# Patient Record
Sex: Female | Born: 1935 | Race: White | Hispanic: No | Smoking: Current every day smoker
Health system: Southern US, Community
[De-identification: ages and names within clinical notes are randomized; demographics above are authoritative.]

## PROBLEM LIST (undated history)

## (undated) DIAGNOSIS — E119 Type 2 diabetes mellitus without complications: Secondary | ICD-10-CM

## (undated) DIAGNOSIS — R42 Dizziness and giddiness: Secondary | ICD-10-CM

## (undated) DIAGNOSIS — I10 Essential (primary) hypertension: Secondary | ICD-10-CM

## (undated) DIAGNOSIS — G4733 Obstructive sleep apnea (adult) (pediatric): Secondary | ICD-10-CM

## (undated) DIAGNOSIS — I2510 Atherosclerotic heart disease of native coronary artery without angina pectoris: Secondary | ICD-10-CM

## (undated) DIAGNOSIS — E785 Hyperlipidemia, unspecified: Secondary | ICD-10-CM

## (undated) HISTORY — PX: OTHER SURGICAL HISTORY: SHX169

## (undated) HISTORY — PX: APPENDECTOMY: SHX54

## (undated) HISTORY — DX: Type 2 diabetes mellitus without complications: E11.9

## (undated) HISTORY — DX: Dizziness and giddiness: R42

## (undated) HISTORY — PX: CARDIAC CATHETERIZATION: SHX172

## (undated) HISTORY — DX: Essential (primary) hypertension: I10

## (undated) HISTORY — DX: Obstructive sleep apnea (adult) (pediatric): G47.33

## (undated) HISTORY — DX: Atherosclerotic heart disease of native coronary artery without angina pectoris: I25.10

## (undated) HISTORY — PX: FOOT SURGERY: SHX648

## (undated) HISTORY — PX: CATARACT EXTRACTION, BILATERAL: SHX1313

## (undated) HISTORY — PX: CHOLECYSTECTOMY: SHX55

## (undated) HISTORY — DX: Hyperlipidemia, unspecified: E78.5

## (undated) HISTORY — PX: TUBAL LIGATION: SHX77

---

## 1998-10-05 ENCOUNTER — Other Ambulatory Visit: Admission: RE | Admit: 1998-10-05 | Discharge: 1998-10-05 | Payer: Self-pay | Admitting: Obstetrics and Gynecology

## 1998-10-05 ENCOUNTER — Ambulatory Visit (HOSPITAL_COMMUNITY): Admission: RE | Admit: 1998-10-05 | Discharge: 1998-10-05 | Payer: Self-pay | Admitting: Obstetrics and Gynecology

## 2000-02-01 ENCOUNTER — Other Ambulatory Visit: Admission: RE | Admit: 2000-02-01 | Discharge: 2000-02-01 | Payer: Self-pay | Admitting: *Deleted

## 2000-02-01 ENCOUNTER — Ambulatory Visit (HOSPITAL_COMMUNITY): Admission: RE | Admit: 2000-02-01 | Discharge: 2000-02-01 | Payer: Self-pay | Admitting: Obstetrics and Gynecology

## 2000-02-01 ENCOUNTER — Encounter: Payer: Self-pay | Admitting: Obstetrics and Gynecology

## 2001-02-03 ENCOUNTER — Other Ambulatory Visit: Admission: RE | Admit: 2001-02-03 | Discharge: 2001-02-03 | Payer: Self-pay | Admitting: Obstetrics and Gynecology

## 2001-02-26 ENCOUNTER — Encounter: Payer: Self-pay | Admitting: Obstetrics and Gynecology

## 2001-02-26 ENCOUNTER — Ambulatory Visit (HOSPITAL_COMMUNITY): Admission: RE | Admit: 2001-02-26 | Discharge: 2001-02-26 | Payer: Self-pay | Admitting: Obstetrics and Gynecology

## 2002-04-02 ENCOUNTER — Other Ambulatory Visit: Admission: RE | Admit: 2002-04-02 | Discharge: 2002-04-02 | Payer: Self-pay | Admitting: Obstetrics and Gynecology

## 2002-04-07 ENCOUNTER — Encounter: Payer: Self-pay | Admitting: Obstetrics and Gynecology

## 2002-04-07 ENCOUNTER — Ambulatory Visit (HOSPITAL_COMMUNITY): Admission: RE | Admit: 2002-04-07 | Discharge: 2002-04-07 | Payer: Self-pay | Admitting: Obstetrics and Gynecology

## 2005-04-10 ENCOUNTER — Inpatient Hospital Stay (HOSPITAL_COMMUNITY): Admission: EM | Admit: 2005-04-10 | Discharge: 2005-04-11 | Payer: Self-pay | Admitting: Emergency Medicine

## 2006-07-19 ENCOUNTER — Emergency Department (HOSPITAL_COMMUNITY): Admission: EM | Admit: 2006-07-19 | Discharge: 2006-07-19 | Payer: Self-pay | Admitting: Emergency Medicine

## 2006-11-20 ENCOUNTER — Encounter: Admission: RE | Admit: 2006-11-20 | Discharge: 2006-11-20 | Payer: Self-pay | Admitting: Internal Medicine

## 2008-12-07 ENCOUNTER — Encounter: Admission: RE | Admit: 2008-12-07 | Discharge: 2008-12-07 | Payer: Self-pay | Admitting: Internal Medicine

## 2009-07-15 ENCOUNTER — Emergency Department (HOSPITAL_COMMUNITY): Admission: EM | Admit: 2009-07-15 | Discharge: 2009-07-15 | Payer: Self-pay | Admitting: Emergency Medicine

## 2010-04-05 ENCOUNTER — Ambulatory Visit (HOSPITAL_COMMUNITY): Admission: RE | Admit: 2010-04-05 | Discharge: 2010-04-05 | Payer: Self-pay | Admitting: Anesthesiology

## 2010-11-06 ENCOUNTER — Encounter: Admission: RE | Admit: 2010-11-06 | Discharge: 2010-11-06 | Payer: Self-pay | Admitting: Internal Medicine

## 2011-01-06 ENCOUNTER — Encounter: Payer: Self-pay | Admitting: Internal Medicine

## 2011-03-24 LAB — URINE MICROSCOPIC-ADD ON

## 2011-03-24 LAB — URINALYSIS, ROUTINE W REFLEX MICROSCOPIC
Bilirubin Urine: NEGATIVE
Glucose, UA: NEGATIVE mg/dL
Hgb urine dipstick: NEGATIVE
Ketones, ur: NEGATIVE mg/dL
Nitrite: NEGATIVE
Protein, ur: NEGATIVE mg/dL
Specific Gravity, Urine: 1.006 (ref 1.005–1.030)
Urobilinogen, UA: 0.2 mg/dL (ref 0.0–1.0)
pH: 6 (ref 5.0–8.0)

## 2011-03-24 LAB — POCT I-STAT, CHEM 8
BUN: 21 mg/dL (ref 6–23)
Calcium, Ion: 1.37 mmol/L — ABNORMAL HIGH (ref 1.12–1.32)
Chloride: 107 mEq/L (ref 96–112)
Creatinine, Ser: 1 mg/dL (ref 0.4–1.2)
Glucose, Bld: 102 mg/dL — ABNORMAL HIGH (ref 70–99)
HCT: 38 % (ref 36.0–46.0)
Hemoglobin: 12.9 g/dL (ref 12.0–15.0)
Potassium: 3.9 mEq/L (ref 3.5–5.1)
Sodium: 141 mEq/L (ref 135–145)
TCO2: 26 mmol/L (ref 0–100)

## 2011-03-24 LAB — CBC
HCT: 37.6 % (ref 36.0–46.0)
Hemoglobin: 12.9 g/dL (ref 12.0–15.0)
MCHC: 34.2 g/dL (ref 30.0–36.0)
MCV: 90.1 fL (ref 78.0–100.0)
Platelets: 239 10*3/uL (ref 150–400)
RBC: 4.18 MIL/uL (ref 3.87–5.11)
RDW: 15 % (ref 11.5–15.5)
WBC: 8.1 10*3/uL (ref 4.0–10.5)

## 2011-03-24 LAB — POCT CARDIAC MARKERS
CKMB, poc: 1.3 ng/mL (ref 1.0–8.0)
Myoglobin, poc: 137 ng/mL (ref 12–200)
Troponin i, poc: <0.05 ng/mL (ref 0.00–0.09)

## 2011-05-03 NOTE — Cardiovascular Report (Signed)
NAME:  Angela Craig, Angela Craig NO.:  [id number]   MEDICAL RECORD NO.:  [id number]          PATIENT TYPE:  INP   LOCATION:  3712                         FACILITY:  [facility]   PHYSICIAN:  Nanetta Batty, M.D.   DATE OF BIRTH:  November 07, 1933   DATE OF PROCEDURE:  DATE OF DISCHARGE:                              CARDIAC CATHETERIZATION   [name] is a 77 year old white female who I initially saw for  palpitations.  Other problems include hypertension and hyperlipidemia.  She  has a negative Cardiolite in the past and 2D echocardiogram.  She is  admitted on April 08, 2005 with chest pain.  She ruled out for myocardial  infarction.  She presents now for diagnostic coronary arteriography.   PROCEDURE DESCRIPTION:  The patient was brought to the second [facility] in the post absorptive state.  She is  premedicated with p.o. Valium.  She is allergic to Novocain and thus  lidocaine was used per protocol for local anesthesia.  A 6 French sheath was  inserted into the right femoral artery using standard Seldinger technique.  The 6 French right Judkins' diagnostic catheter as well as a 6 French  pigtail catheter were used for selective coronary angiography, and left  ventriculography, respectively.  Visipaque dye was used for the entirety of  the case.  Retrograde aorta, left ventricular __________ pressures were  recorded.   HEMODYNAMICS:  1.  Aortic systolic pressure 138, diastolic pressure 56.  2.  Left ventricular systolic pressure 142, end diastolic pressure 6.  There      is no pull back gradient noted.   SELECTIVE CORONARY ANGIOGRAPHY:  1.  Left main normal.  2.  Left anterior descending:  The LAD had a 40 to 50% segmental lesion at      the junction of the proximal and mid third.  Otherwise, it was free of      significant disease.  3.  Left circumflex:  The left circumflex had a 40 to 50% segmental proximal      stenosis at its take off.  4.  Right coronary artery:  This is a dominant vessel that had 60% tubular      stenosis at the first bend and 40-50% segmental stenosis in the mid      portion.  5.  Left ventriculography:  RAO left ventriculogram was performed using 25      cc of Visipaque dye 12 cc per second.  The overall left ventricular      ejection fraction was estimated greater than 60% without focal wall      motion abnormalities.   IMPRESSION:  [name] has moderate to critical coronary artery disease with  normal left ventricular function.  I believe her chest pain is noncardiac.  An ACT is measured and the sheaths were removed.  Pressure was held in the  groin to achieve hemostasis.  The patient left the laboratory in stable  condition.  She will be discharged and later seen as an outpatient and will  have an exercise Cardiolite stress test performed to assess the physiologic  significance of the proximal right coronary artery lesion after which time,  she will be seen back in followup.      JB/MEDQ  D:  04/11/2005  T:  04/11/2005  Job:  [id number]   cc:   [facility]   [facility]  [address], [place],  [place]  [address]   Lacretia Leigh. Quintella Reichert, M.D.

## 2011-05-03 NOTE — Discharge Summary (Signed)
[name], Angela Craig NO.:  [id number]   MEDICAL RECORD NO.:  [id number]          PATIENT TYPE:  INP   LOCATION:  3712                         FACILITY:  [facility]   PHYSICIAN:  Abelino Derrick, P.A.   DATE OF BIRTH:  [birth year]   DATE OF ADMISSION:  04/10/2005  DATE OF DISCHARGE:  04/11/2005                                 DISCHARGE SUMMARY   DISCHARGE DIAGNOSES:  1.  Chest pain, noncritical coronary disease at catheterization this      admission.  2.  Dyslipidemia.  3.  Treated hypertension.  4.  Non-insulin-dependent diabetes mellitus.   HOSPITAL COURSE:  The patient is a 77 year old female from [place], who  presented with three days of increasing palpitations and chest pressure.  Symptoms have been worse over the last 72 hours.  Please see Admission  History and Physical for complete details.  She has had a negative  Cardiolite in 2003 and an echocardiogram showing an EF of 60% in the past.  She was admitted by Dr. Clarene Duke.  Her primary doctor is Dr. __________in  [place].  EKG showed sinus rhythm without acute changes, her troponins were  negative x2.  She does have a history of NOVOCAIN ALLERGY.  She had frequent  PVCs on admission.  She underwent diagnostic catheterization, April 11, 2005, by Dr. Allyson Sabal, which revealed noncritical coronary disease with a 40 to  50% proximal circumflex, 40 to 50% mid RCA, and 40 to 50% mid LAD.  She was  sent home later that same day.   DISCHARGE MEDICATIONS:  1.  Lipitor 20 mg once a day.  2.  Nexium 40 mg a day.  3.  Avandia 4 mg twice a day.  4.  Benazepril/hydrochlorothiazide 10/12.5 once a day.  5.  Glucotrol-XL 10 mg a day.  6.  Toprol-XL 50 mg a day.  7.  Evista 60 mg a day.  8.  Aspirin daily.   LABORATORY DATA:  Chest x-ray showed mild cardiomegaly and mild to moderate  COPD.  White count 7.4, hemoglobin 12.5, hematocrit 36.5, platelets 236, INR  is 0.9.  Sodium 140, potassium 3.9, BUN 20, creatinine 0.9.   Liver functions  are normal.  CK-MB and troponins are negative x3.  Cholesterol is 197 with  an HDL of 37 and an LDL 123, TSH 0.98.  EKG, according to Dr. Fredirick Maudlin note,  showed sinus rhythm without acute changes and PVCs.   DISPOSITION:  The patient is discharged in stable condition and will follow  up with Dr. Allyson Sabal in a couple of weeks in the office.      LKK/MEDQ  D:  05/30/2005  T:  05/30/2005  Job:  [id number]

## 2011-07-08 ENCOUNTER — Other Ambulatory Visit (HOSPITAL_COMMUNITY): Payer: Self-pay | Admitting: Internal Medicine

## 2011-07-08 DIAGNOSIS — Z1231 Encounter for screening mammogram for malignant neoplasm of breast: Secondary | ICD-10-CM

## 2011-07-17 ENCOUNTER — Ambulatory Visit (HOSPITAL_COMMUNITY)
Admission: RE | Admit: 2011-07-17 | Discharge: 2011-07-17 | Disposition: A | Discharge status: Home / Self Care | Payer: Medicare Other | Source: Ambulatory Visit | Attending: Internal Medicine | Admitting: Internal Medicine

## 2011-07-17 DIAGNOSIS — Z1231 Encounter for screening mammogram for malignant neoplasm of breast: Secondary | ICD-10-CM | POA: Insufficient documentation

## 2012-08-24 ENCOUNTER — Other Ambulatory Visit (HOSPITAL_COMMUNITY): Payer: Self-pay | Admitting: Internal Medicine

## 2012-08-24 DIAGNOSIS — Z1231 Encounter for screening mammogram for malignant neoplasm of breast: Secondary | ICD-10-CM

## 2012-09-03 ENCOUNTER — Ambulatory Visit (HOSPITAL_COMMUNITY)
Admission: RE | Admit: 2012-09-03 | Discharge: 2012-09-03 | Disposition: A | Discharge status: Home / Self Care | Payer: Medicare Other | Source: Ambulatory Visit | Attending: Internal Medicine | Admitting: Internal Medicine

## 2012-09-03 DIAGNOSIS — Z1231 Encounter for screening mammogram for malignant neoplasm of breast: Secondary | ICD-10-CM

## 2012-09-16 ENCOUNTER — Ambulatory Visit: Payer: Medicare Other | Attending: Internal Medicine | Admitting: *Deleted

## 2012-09-16 DIAGNOSIS — IMO0001 Reserved for inherently not codable concepts without codable children: Secondary | ICD-10-CM | POA: Insufficient documentation

## 2012-09-16 DIAGNOSIS — Z9181 History of falling: Secondary | ICD-10-CM | POA: Insufficient documentation

## 2012-09-16 DIAGNOSIS — R269 Unspecified abnormalities of gait and mobility: Secondary | ICD-10-CM | POA: Insufficient documentation

## 2012-09-22 ENCOUNTER — Encounter: Payer: Medicare Other | Admitting: Physical Therapy

## 2012-09-29 ENCOUNTER — Encounter: Payer: Medicare Other | Admitting: *Deleted

## 2012-10-02 ENCOUNTER — Encounter: Payer: Medicare Other | Admitting: *Deleted

## 2012-10-06 ENCOUNTER — Encounter: Payer: Medicare Other | Admitting: *Deleted

## 2012-10-09 ENCOUNTER — Encounter: Payer: Medicare Other | Admitting: *Deleted

## 2012-10-13 ENCOUNTER — Encounter: Payer: Medicare Other | Admitting: *Deleted

## 2012-10-15 ENCOUNTER — Encounter: Payer: Medicare Other | Admitting: *Deleted

## 2012-10-19 ENCOUNTER — Encounter: Payer: Medicare Other | Admitting: *Deleted

## 2012-10-21 ENCOUNTER — Encounter: Payer: Medicare Other | Admitting: *Deleted

## 2014-03-25 ENCOUNTER — Other Ambulatory Visit (HOSPITAL_COMMUNITY): Payer: Self-pay | Admitting: Internal Medicine

## 2014-03-25 DIAGNOSIS — Z1231 Encounter for screening mammogram for malignant neoplasm of breast: Secondary | ICD-10-CM

## 2014-04-01 ENCOUNTER — Ambulatory Visit (HOSPITAL_COMMUNITY)
Admission: RE | Admit: 2014-04-01 | Discharge: 2014-04-01 | Disposition: A | Discharge status: Home / Self Care | Payer: Medicare Other | Source: Ambulatory Visit | Attending: Internal Medicine | Admitting: Internal Medicine

## 2014-04-01 DIAGNOSIS — Z1231 Encounter for screening mammogram for malignant neoplasm of breast: Secondary | ICD-10-CM

## 2014-12-27 DIAGNOSIS — I10 Essential (primary) hypertension: Secondary | ICD-10-CM | POA: Diagnosis not present

## 2014-12-27 DIAGNOSIS — E1129 Type 2 diabetes mellitus with other diabetic kidney complication: Secondary | ICD-10-CM | POA: Diagnosis not present

## 2015-01-03 DIAGNOSIS — I10 Essential (primary) hypertension: Secondary | ICD-10-CM | POA: Diagnosis not present

## 2015-01-03 DIAGNOSIS — E1121 Type 2 diabetes mellitus with diabetic nephropathy: Secondary | ICD-10-CM | POA: Diagnosis not present

## 2015-01-03 DIAGNOSIS — E785 Hyperlipidemia, unspecified: Secondary | ICD-10-CM | POA: Diagnosis not present

## 2015-01-03 DIAGNOSIS — E1165 Type 2 diabetes mellitus with hyperglycemia: Secondary | ICD-10-CM | POA: Diagnosis not present

## 2015-01-18 DIAGNOSIS — H3531 Nonexudative age-related macular degeneration: Secondary | ICD-10-CM | POA: Diagnosis not present

## 2015-01-18 DIAGNOSIS — Z961 Presence of intraocular lens: Secondary | ICD-10-CM | POA: Diagnosis not present

## 2015-01-18 DIAGNOSIS — H40033 Anatomical narrow angle, bilateral: Secondary | ICD-10-CM | POA: Diagnosis not present

## 2015-01-18 DIAGNOSIS — E119 Type 2 diabetes mellitus without complications: Secondary | ICD-10-CM | POA: Diagnosis not present

## 2015-01-25 DIAGNOSIS — R42 Dizziness and giddiness: Secondary | ICD-10-CM | POA: Diagnosis not present

## 2015-02-06 DIAGNOSIS — M9903 Segmental and somatic dysfunction of lumbar region: Secondary | ICD-10-CM | POA: Diagnosis not present

## 2015-02-06 DIAGNOSIS — M9904 Segmental and somatic dysfunction of sacral region: Secondary | ICD-10-CM | POA: Diagnosis not present

## 2015-02-06 DIAGNOSIS — M9902 Segmental and somatic dysfunction of thoracic region: Secondary | ICD-10-CM | POA: Diagnosis not present

## 2015-02-06 DIAGNOSIS — M9905 Segmental and somatic dysfunction of pelvic region: Secondary | ICD-10-CM | POA: Diagnosis not present

## 2015-02-14 DIAGNOSIS — H903 Sensorineural hearing loss, bilateral: Secondary | ICD-10-CM | POA: Diagnosis not present

## 2015-02-14 DIAGNOSIS — R42 Dizziness and giddiness: Secondary | ICD-10-CM | POA: Diagnosis not present

## 2015-03-09 ENCOUNTER — Other Ambulatory Visit (HOSPITAL_COMMUNITY): Payer: Self-pay | Admitting: Internal Medicine

## 2015-03-09 DIAGNOSIS — Z1231 Encounter for screening mammogram for malignant neoplasm of breast: Secondary | ICD-10-CM

## 2015-04-04 DIAGNOSIS — Z0000 Encounter for general adult medical examination without abnormal findings: Secondary | ICD-10-CM | POA: Diagnosis not present

## 2015-04-04 DIAGNOSIS — I10 Essential (primary) hypertension: Secondary | ICD-10-CM | POA: Diagnosis not present

## 2015-04-04 DIAGNOSIS — Z23 Encounter for immunization: Secondary | ICD-10-CM | POA: Diagnosis not present

## 2015-04-04 DIAGNOSIS — N390 Urinary tract infection, site not specified: Secondary | ICD-10-CM | POA: Diagnosis not present

## 2015-04-04 DIAGNOSIS — M810 Age-related osteoporosis without current pathological fracture: Secondary | ICD-10-CM | POA: Diagnosis not present

## 2015-04-04 DIAGNOSIS — E1121 Type 2 diabetes mellitus with diabetic nephropathy: Secondary | ICD-10-CM | POA: Diagnosis not present

## 2015-04-04 DIAGNOSIS — Z1389 Encounter for screening for other disorder: Secondary | ICD-10-CM | POA: Diagnosis not present

## 2015-04-11 DIAGNOSIS — E785 Hyperlipidemia, unspecified: Secondary | ICD-10-CM | POA: Diagnosis not present

## 2015-04-11 DIAGNOSIS — E1122 Type 2 diabetes mellitus with diabetic chronic kidney disease: Secondary | ICD-10-CM | POA: Diagnosis not present

## 2015-04-11 DIAGNOSIS — I10 Essential (primary) hypertension: Secondary | ICD-10-CM | POA: Diagnosis not present

## 2015-04-11 DIAGNOSIS — E1165 Type 2 diabetes mellitus with hyperglycemia: Secondary | ICD-10-CM | POA: Diagnosis not present

## 2015-04-17 ENCOUNTER — Ambulatory Visit (HOSPITAL_COMMUNITY)
Admission: RE | Admit: 2015-04-17 | Discharge: 2015-04-17 | Disposition: A | Discharge status: Home / Self Care | Payer: Medicare Other | Source: Ambulatory Visit | Attending: Internal Medicine | Admitting: Internal Medicine

## 2015-04-17 DIAGNOSIS — Z1231 Encounter for screening mammogram for malignant neoplasm of breast: Secondary | ICD-10-CM | POA: Diagnosis not present

## 2015-06-27 DIAGNOSIS — E1165 Type 2 diabetes mellitus with hyperglycemia: Secondary | ICD-10-CM | POA: Diagnosis not present

## 2015-06-27 DIAGNOSIS — I10 Essential (primary) hypertension: Secondary | ICD-10-CM | POA: Diagnosis not present

## 2015-06-27 DIAGNOSIS — M810 Age-related osteoporosis without current pathological fracture: Secondary | ICD-10-CM | POA: Diagnosis not present

## 2015-08-01 ENCOUNTER — Encounter: Payer: Self-pay | Admitting: Cardiovascular Disease

## 2015-08-07 DIAGNOSIS — F17211 Nicotine dependence, cigarettes, in remission: Secondary | ICD-10-CM | POA: Diagnosis not present

## 2015-08-07 DIAGNOSIS — N182 Chronic kidney disease, stage 2 (mild): Secondary | ICD-10-CM | POA: Diagnosis not present

## 2015-08-07 DIAGNOSIS — I130 Hypertensive heart and chronic kidney disease with heart failure and stage 1 through stage 4 chronic kidney disease, or unspecified chronic kidney disease: Secondary | ICD-10-CM | POA: Diagnosis not present

## 2015-08-07 DIAGNOSIS — E785 Hyperlipidemia, unspecified: Secondary | ICD-10-CM | POA: Diagnosis not present

## 2015-08-07 DIAGNOSIS — Z23 Encounter for immunization: Secondary | ICD-10-CM | POA: Diagnosis not present

## 2015-08-07 DIAGNOSIS — E1140 Type 2 diabetes mellitus with diabetic neuropathy, unspecified: Secondary | ICD-10-CM | POA: Diagnosis not present

## 2015-08-17 ENCOUNTER — Encounter: Payer: Self-pay | Admitting: Cardiovascular Disease

## 2015-08-29 DIAGNOSIS — M5032 Other cervical disc degeneration, mid-cervical region: Secondary | ICD-10-CM | POA: Diagnosis not present

## 2015-08-29 DIAGNOSIS — M9901 Segmental and somatic dysfunction of cervical region: Secondary | ICD-10-CM | POA: Diagnosis not present

## 2015-08-29 DIAGNOSIS — M9903 Segmental and somatic dysfunction of lumbar region: Secondary | ICD-10-CM | POA: Diagnosis not present

## 2015-08-29 DIAGNOSIS — M5432 Sciatica, left side: Secondary | ICD-10-CM | POA: Diagnosis not present

## 2015-08-29 DIAGNOSIS — M5126 Other intervertebral disc displacement, lumbar region: Secondary | ICD-10-CM | POA: Diagnosis not present

## 2015-09-01 DIAGNOSIS — R296 Repeated falls: Secondary | ICD-10-CM | POA: Diagnosis not present

## 2015-09-01 DIAGNOSIS — Z043 Encounter for examination and observation following other accident: Secondary | ICD-10-CM | POA: Diagnosis not present

## 2015-09-01 DIAGNOSIS — R269 Unspecified abnormalities of gait and mobility: Secondary | ICD-10-CM | POA: Diagnosis not present

## 2015-09-01 DIAGNOSIS — M549 Dorsalgia, unspecified: Secondary | ICD-10-CM | POA: Diagnosis not present

## 2015-09-01 DIAGNOSIS — M25559 Pain in unspecified hip: Secondary | ICD-10-CM | POA: Diagnosis not present

## 2015-09-01 DIAGNOSIS — M542 Cervicalgia: Secondary | ICD-10-CM | POA: Diagnosis not present

## 2015-09-01 DIAGNOSIS — S32030A Wedge compression fracture of third lumbar vertebra, initial encounter for closed fracture: Secondary | ICD-10-CM | POA: Diagnosis not present

## 2015-09-01 DIAGNOSIS — R2689 Other abnormalities of gait and mobility: Secondary | ICD-10-CM | POA: Diagnosis not present

## 2015-09-01 DIAGNOSIS — E119 Type 2 diabetes mellitus without complications: Secondary | ICD-10-CM | POA: Diagnosis not present

## 2015-09-14 DIAGNOSIS — R2689 Other abnormalities of gait and mobility: Secondary | ICD-10-CM | POA: Diagnosis not present

## 2015-09-14 DIAGNOSIS — R296 Repeated falls: Secondary | ICD-10-CM | POA: Diagnosis not present

## 2015-09-14 DIAGNOSIS — R269 Unspecified abnormalities of gait and mobility: Secondary | ICD-10-CM | POA: Diagnosis not present

## 2015-09-14 DIAGNOSIS — M549 Dorsalgia, unspecified: Secondary | ICD-10-CM | POA: Diagnosis not present

## 2015-10-04 ENCOUNTER — Encounter: Payer: Self-pay | Admitting: Neurology

## 2015-10-04 ENCOUNTER — Ambulatory Visit (INDEPENDENT_AMBULATORY_CARE_PROVIDER_SITE_OTHER): Payer: Medicare Other | Admitting: Neurology

## 2015-10-04 VITALS — BP 140/64 | HR 66 | Resp 16 | Ht 64.0 in | Wt 186.0 lb

## 2015-10-04 DIAGNOSIS — M47816 Spondylosis without myelopathy or radiculopathy, lumbar region: Secondary | ICD-10-CM

## 2015-10-04 DIAGNOSIS — R296 Repeated falls: Secondary | ICD-10-CM | POA: Diagnosis not present

## 2015-10-04 DIAGNOSIS — R29818 Other symptoms and signs involving the nervous system: Secondary | ICD-10-CM | POA: Diagnosis not present

## 2015-10-04 DIAGNOSIS — E1140 Type 2 diabetes mellitus with diabetic neuropathy, unspecified: Secondary | ICD-10-CM

## 2015-10-04 DIAGNOSIS — Z9181 History of falling: Secondary | ICD-10-CM | POA: Diagnosis not present

## 2015-10-04 DIAGNOSIS — G4733 Obstructive sleep apnea (adult) (pediatric): Secondary | ICD-10-CM

## 2015-10-04 DIAGNOSIS — R2689 Other abnormalities of gait and mobility: Secondary | ICD-10-CM

## 2015-10-04 NOTE — Progress Notes (Signed)
Subjective:    Patient ID: Angela Craig is a 81 y.o. female.  HPI     Huston Foley, MD, PhD [facility] [address] [address] [place], [place] [address]  Dear Dr. Thea Silversmith,   I saw your patient, Angela Craig, upon your kind request in my neurologic clinic today for initial consultation of her balance problems and recurrent falls. The patient is accompanied by her daughter, Angela Craig, today. As you know, Ms. Liss is a very pleasant 81 year old right-handed woman with an underlying medical history of type 2 diabetes, chronic renal failure, hypertension, hyperlipidemia, and low back pain, who reports recurrent falls. This has been going on for the past few years. Symptoms started gradually 2 or 3 years ago. She used to fall very infrequently, then with time she started falling about once a month, then once a week, and more recently in the past several weeks she has fallen almost twice a week. In the past she used to live by herself. Some 2 months ago she moved in with her daughter, Angela Craig, her daughters 37 year old son, and patient's son, Angela Craig. She has 5 children. She has a call alert button. She tried physical therapy about a year ago as an outpatient but it became too costly for her. She reports a tendency to stagger. She feels at times lightheaded. She has had vertigo before. Of note, she was on diabetes medication but as she improved her diet and lost weight, her numbers improved. She could not afford the special diet any longer and gained weight and her blood sugar values deteriorated as well. In addition, she has a prior diagnosis of obstructive sleep apnea and is supposed to be on a CPAP machine but does not longer use it. She has the machine still. She has a cane which she uses more consistently now but she also has 2 walkers at the house which she does not like to use.  I reviewed your office note from 09/01/2015, which you kindly included. She has been seeing  a [facility]. Recent blood test results include a CBC with differential with fairly normal findings, CMP showed blood sugar of 115, alkaline phosphatase of 128, calcium of 10.8, and A1c of 7.5. Urinalysis was negative. Of note, she does not drink enough water. She drinks maybe one bottle of water, 16 ounce. She drinks a whole pot of coffee throughout the day. She quit smoking a couple of years ago but does admit to using the vapor cigarettes.  Her Past Medical History Is Significant For: Past Medical History  Diagnosis Date  . Hypertension   . Hyperlipidemia   . Diabetes mellitus (HCC)   . CAD (coronary artery disease)     Non-critical by catheterization on April 27,2006 with normal LV function--last echocardiogram in 2003 which showed normal LVEF--last nuclear stress in August 2010 showed no significant ischemia with an EF of 70%  . Obstructive sleep apnea     followed by Dr. Tresa Endo  . Dizziness     Her Past Surgical History Is Significant For: Past Surgical History  Procedure Laterality Date  . Cardiac catheterization    . Appendectomy    . Cholecystectomy    . Foot surgery    . Tumor removal      Her Family History Is Significant For: Family History  Problem Relation Age of Onset  . Diabetes Mother   . Heart attack Mother   . Heart disease Father   . Cancer Sister   .  Cancer Brother   . Diabetes Brother   . Diabetes Sister     Her Social History Is Significant For: Social History   Social History  . Marital Status: Widowed    Spouse Name: N/A  . Number of Children: 5  . Years of Education: 12    Occupational History  . Retired    Social History Main Topics  . Smoking status: Former Games developermoker  . Smokeless tobacco: Current User     Comment: Quit 2014  . Alcohol Use: No  . Drug Use: No  . Sexual Activity: Not Asked   Other Topics Concern  . None   Social History Narrative   Drinks about a pot of coffee a day, no tea or soda    Her Allergies Are:   Allergies  Allergen Reactions  . Iodine   . Novocain [Procaine]   :   Her Current Medications Are:  Outpatient Encounter Prescriptions as of 10/04/2015  Medication Sig  . aspirin 81 MG tablet Take 81 mg by mouth daily.  . benazepril (LOTENSIN) 40 MG tablet   . Cholecalciferol (VITAMIN D3) 2000 UNITS TABS Take by mouth.  . esomeprazole (NEXIUM) 40 MG capsule Take 40 mg by mouth daily at 12 noon.  . fluticasone (FLONASE) 50 MCG/ACT nasal spray Place into both nostrils daily.  . hydrochlorothiazide (MICROZIDE) 12.5 MG capsule   . Liraglutide (VICTOZA) 18 MG/3ML SOPN Inject into the skin.  . metoprolol (LOPRESSOR) 50 MG tablet Take 75 mg by mouth 2 (two) times daily.  . Multiple Vitamin (MULTIVITAMIN) tablet Take 1 tablet by mouth daily.  . simvastatin (ZOCOR) 80 MG tablet Take 40 mg by mouth daily.   . [DISCONTINUED] BENAZEPRIL-HYDROCHLOROTHIAZIDE PO Take by mouth.  . [DISCONTINUED] pioglitazone (ACTOS) 30 MG tablet Take 30 mg by mouth daily.   No facility-administered encounter medications on file as of 10/04/2015.  :   Review of Systems:  Out of a complete 14 point review of systems, all are reviewed and negative with the exception of these symptoms as listed below:   Review of Systems  Neurological:       Patient has had falls for years. Recently she has started falling a couple times a week. Patient says that she does not know what causes these falls. Occasionally has dizziness but not often.     Objective:  Neurologic Exam  Physical Exam Physical Examination:   Filed Vitals:   10/04/15 1020  BP: 140/64  Pulse: 66  Resp: 16    General Examination: The patient is a very pleasant 81 y.o. female in no acute distress. She appears well-developed and well-nourished and well groomed. She is obese.   HEENT: Normocephalic, atraumatic, pupils are equal, round and reactive to light and accommodation. Funduscopic exam is normal with sharp disc margins noted. She is status post  bilateral cataract repairs. Extraocular tracking is good without limitation to gaze excursion or nystagmus noted. Normal smooth pursuit is noted. Hearing is grossly intact. Face is symmetric with normal facial animation and normal facial sensation. Speech is clear with no dysarthria noted. There is no hypophonia. There is no lip, neck/head, jaw or voice tremor. Neck is supple with full range of passive and active motion. There are no carotid bruits on auscultation. Oropharynx exam reveals: moderate mouth dryness, adequate dental hygiene and mild airway crowding   Chest: Clear to auscultation without wheezing, rhonchi or crackles noted.  Heart: S1+S2+0, regular and normal without murmurs, rubs or gallops noted.   Abdomen: Soft,  non-tender and non-distended with normal bowel sounds appreciated on auscultation.  Extremities: There is no pitting edema in the distal lower extremities bilaterally. Pedal pulses are intact.  Skin: Warm and dry without trophic changes noted, but feet are slightly colder and mildly erythematous. There are no varicose veins.  Musculoskeletal: exam reveals no obvious joint deformities, tenderness or joint swelling or erythema.   Neurologically:  Mental status: The patient is awake, alert and oriented in all 4 spheres. Her immediate and remote memory, attention, language skills and fund of knowledge are appropriate. There is no evidence of aphasia, agnosia, apraxia or anomia. Speech is clear with normal prosody and enunciation. Thought process is linear. Mood is normal and affect is normal.  Cranial nerves II - XII are as described above under HEENT exam. In addition: shoulder shrug is normal with equal shoulder height noted. Motor exam: Normal bulk, strength and tone is noted, with the exception of mild weakness of the left leg. This is not new. There is no drift, tremor or rebound. Romberg is negative. Reflexes are 1+ in the upper extremities, trace and lower extremities  bilaterally. Fine motor skills are mildly impaired throughout. throughout. Babinski: Toes are flexor bilaterally. Fine motor skills and coordination: intact with normal finger taps, normal hand movements, normal rapid alternating patting, normal foot taps and normal foot agility.  Cerebellar testing: No dysmetria or intention tremor on finger to nose testing. Heel to shin is unremarkable bilaterally. There is no truncal or gait ataxia.  Sensory exam: intact to light touch, pinprick, vibration, temperature sense in the upper extremities, but she has decreased sensation to pinprick and temperature in the feet bilaterally, left more pronounced than right. She stands slowly. She stands slightly wide-based. She walks cautiously with her cane. She turns in 3 steps. Tandem walk is not possible.   Assessment and Plan:   In summary, RYLIN SAEZ is a very pleasant 81 y.o.-year old female with an underlying medical history of type 2 diabetes,obesity, obstructive sleep apnea,  chronic renal failure, hypertension, recent smoking cessation,  hyperlipidemia, and low back pain, who reports recurrent falls. This has been going on for the past few years. On examination, she has evidence of mild neuropathy. She has a mild degree of left leg weakness which is not new. 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 In addition she reports low back pain and left leg weakness and fear of falling which exacerbates the situation. She is advised that there is no specific treatment available for multifactorial balance issues. I suggested the following to her today: I would like for her to increase her water intake and decrease her coffee intake. She is advised to start using her CPAP machine again for her sleep apnea. In addition, she is advised to use her  walker for safety. Maybe she can be considered for physical therapy again as an outpatient down the Road. She is advised to work on more stringent blood sugar control. She is advised to try to exercise regularly within her limitations and within safety concerns. She has good family support and is no longer living alone which is a good idea. She has risk factors for stroke. We will go ahead and do a brain MRI without contrast and call her with results. I will recheck on her in a few months, sooner if needed. Thank you  very much for allowing me to participate in the care of this nice patient. If I can be of any further assistance to you please do not hesitate to call me at [phone].  Sincerely,   Star Age, MD, PhD

## 2015-10-04 NOTE — Patient Instructions (Addendum)
I believe you have a gait disorder, which likely is due to a combination of things: normal aging, degenerative arthritis of your back, neuropathy, obesity, untreated obstructive sleep apnea, tendency towards dehydration, and possible atherosclerosis of the blood vessels in your brain.   Remember to drink plenty of fluid, eat healthy meals and do not skip any meals. Try to eat protein with a every meal and eat a healthy snack such as fruit or nuts in between meals. Try to keep a regular sleep-wake schedule and try to exercise daily, particularly in the form of walking, 20 minutes a day, if you can. Change positions slowly and you should start using your walker. Decrease your coffee intake and increase your water intake.    As far as your medications are concerned, I would like to suggest no new medications. We may consider physical therapy.   As far as diagnostic testing: MRI brain. Please make sure, you have had your TSH, vitamin D and vitamin B12 tested with Dr. Thea SilversmithMackenzie.   Restart your CPAP.

## 2015-10-24 ENCOUNTER — Ambulatory Visit
Admission: RE | Admit: 2015-10-24 | Discharge: 2015-10-24 | Disposition: A | Discharge status: Home / Self Care | Payer: Medicare Other | Source: Ambulatory Visit | Attending: Neurology | Admitting: Neurology

## 2015-10-24 DIAGNOSIS — Z9181 History of falling: Secondary | ICD-10-CM

## 2015-10-24 DIAGNOSIS — G4733 Obstructive sleep apnea (adult) (pediatric): Secondary | ICD-10-CM

## 2015-10-24 DIAGNOSIS — M47816 Spondylosis without myelopathy or radiculopathy, lumbar region: Secondary | ICD-10-CM

## 2015-10-24 DIAGNOSIS — R29818 Other symptoms and signs involving the nervous system: Secondary | ICD-10-CM | POA: Diagnosis not present

## 2015-10-24 DIAGNOSIS — E1140 Type 2 diabetes mellitus with diabetic neuropathy, unspecified: Secondary | ICD-10-CM

## 2015-10-24 DIAGNOSIS — R296 Repeated falls: Secondary | ICD-10-CM | POA: Diagnosis not present

## 2015-10-24 DIAGNOSIS — R2689 Other abnormalities of gait and mobility: Secondary | ICD-10-CM

## 2015-10-26 ENCOUNTER — Telehealth: Payer: Self-pay

## 2015-10-26 NOTE — Telephone Encounter (Signed)
I left message for patient to call back for test results.

## 2015-10-26 NOTE — Telephone Encounter (Signed)
I spoke to patient and gave her results and recommendation. She voiced understanding.

## 2015-10-26 NOTE — Progress Notes (Signed)
Quick Note:  Please call patient regarding the recent brain MRI: The brain scan showed a normal structure of the brain and mild volume loss which we call atrophy. There were changes in the deeper structures of the brain, which we call white matter changes or microvascular changes. These were reported as mild in Her case. These are tiny white spots, that occur with time and are seen in a variety of conditions, including with normal aging, chronic hypertension, chronic headaches, especially migraine HAs, chronic diabetes, chronic hyperlipidemia. These are not strokes and no mass or lesion or contrast enhancement was seen which is reassuring. However, she did have 2 small old-looking strokes on the left side, these are no new or acute. These could've happened months or years ago. She does have risk factors for stroke, and it is important to take care of blood pressure, cholesterol, blood sugar, weight management, and treat sleep apnea if she has it. She needs to continue to take a baby aspirin. Please remind patient to keep any upcoming appointments or tests and to call us with any interim questions, concerns, problems or updates. Thanks,  Huston FoleySaima Natsha Guidry, MD, PhD    ______

## 2015-10-26 NOTE — Telephone Encounter (Signed)
Patient returned call

## 2015-10-26 NOTE — Telephone Encounter (Signed)
-----   Message from Huston FoleySaima Athar, MD sent at 10/26/2015 12:58 PM EST ----- Please call patient regarding the recent brain MRI: The brain scan showed a normal structure of the brain and mild volume loss which we call atrophy. There were changes in the deeper structures of the brain, which we call white matter changes or microvascular changes. These were reported as mild in Her case. These are tiny white spots, that occur with time and are seen in a variety of conditions, including with normal aging, chronic hypertension, chronic headaches, especially migraine HAs, chronic diabetes, chronic hyperlipidemia. These are not strokes and no mass or lesion or contrast enhancement was seen which is reassuring. However, she did have 2 small old-looking strokes on the left side, these are no new or acute. These could've happened months or years ago. She does have risk factors for stroke, and it is important to take care of blood pressure, cholesterol, blood sugar, weight management,  and treat sleep apnea if she has it. She needs to continue to take a baby aspirin. Please remind patient to keep any upcoming appointments or tests and to call us with any interim questions, concerns, problems or updates. Thanks,  Huston FoleySaima Athar, MD, PhD

## 2015-10-31 DIAGNOSIS — M810 Age-related osteoporosis without current pathological fracture: Secondary | ICD-10-CM | POA: Diagnosis not present

## 2015-10-31 DIAGNOSIS — I130 Hypertensive heart and chronic kidney disease with heart failure and stage 1 through stage 4 chronic kidney disease, or unspecified chronic kidney disease: Secondary | ICD-10-CM | POA: Diagnosis not present

## 2015-10-31 DIAGNOSIS — E1140 Type 2 diabetes mellitus with diabetic neuropathy, unspecified: Secondary | ICD-10-CM | POA: Diagnosis not present

## 2015-11-07 DIAGNOSIS — E1122 Type 2 diabetes mellitus with diabetic chronic kidney disease: Secondary | ICD-10-CM | POA: Diagnosis not present

## 2015-11-07 DIAGNOSIS — I509 Heart failure, unspecified: Secondary | ICD-10-CM | POA: Diagnosis not present

## 2015-11-07 DIAGNOSIS — I2510 Atherosclerotic heart disease of native coronary artery without angina pectoris: Secondary | ICD-10-CM | POA: Diagnosis not present

## 2015-11-07 DIAGNOSIS — I129 Hypertensive chronic kidney disease with stage 1 through stage 4 chronic kidney disease, or unspecified chronic kidney disease: Secondary | ICD-10-CM | POA: Diagnosis not present

## 2015-11-07 DIAGNOSIS — F17211 Nicotine dependence, cigarettes, in remission: Secondary | ICD-10-CM | POA: Diagnosis not present

## 2016-02-06 ENCOUNTER — Ambulatory Visit: Payer: Self-pay | Admitting: Neurology

## 2016-03-01 DIAGNOSIS — E559 Vitamin D deficiency, unspecified: Secondary | ICD-10-CM | POA: Diagnosis not present

## 2016-03-01 DIAGNOSIS — I509 Heart failure, unspecified: Secondary | ICD-10-CM | POA: Diagnosis not present

## 2016-03-01 DIAGNOSIS — M810 Age-related osteoporosis without current pathological fracture: Secondary | ICD-10-CM | POA: Diagnosis not present

## 2016-03-01 DIAGNOSIS — E1122 Type 2 diabetes mellitus with diabetic chronic kidney disease: Secondary | ICD-10-CM | POA: Diagnosis not present

## 2016-03-07 DIAGNOSIS — I209 Angina pectoris, unspecified: Secondary | ICD-10-CM | POA: Diagnosis not present

## 2016-03-07 DIAGNOSIS — E1122 Type 2 diabetes mellitus with diabetic chronic kidney disease: Secondary | ICD-10-CM | POA: Diagnosis not present

## 2016-03-07 DIAGNOSIS — E785 Hyperlipidemia, unspecified: Secondary | ICD-10-CM | POA: Diagnosis not present

## 2016-03-07 DIAGNOSIS — N182 Chronic kidney disease, stage 2 (mild): Secondary | ICD-10-CM | POA: Diagnosis not present

## 2016-05-07 DIAGNOSIS — R0781 Pleurodynia: Secondary | ICD-10-CM | POA: Diagnosis not present

## 2016-05-07 DIAGNOSIS — M79641 Pain in right hand: Secondary | ICD-10-CM | POA: Diagnosis not present

## 2016-05-07 DIAGNOSIS — W19XXXA Unspecified fall, initial encounter: Secondary | ICD-10-CM | POA: Diagnosis not present

## 2016-05-14 DIAGNOSIS — K5900 Constipation, unspecified: Secondary | ICD-10-CM | POA: Diagnosis not present

## 2016-05-14 DIAGNOSIS — R0781 Pleurodynia: Secondary | ICD-10-CM | POA: Diagnosis not present

## 2016-06-06 DIAGNOSIS — M545 Low back pain: Secondary | ICD-10-CM | POA: Diagnosis not present

## 2016-06-07 DIAGNOSIS — E559 Vitamin D deficiency, unspecified: Secondary | ICD-10-CM | POA: Diagnosis not present

## 2016-06-07 DIAGNOSIS — Z0000 Encounter for general adult medical examination without abnormal findings: Secondary | ICD-10-CM | POA: Diagnosis not present

## 2016-06-07 DIAGNOSIS — Z1389 Encounter for screening for other disorder: Secondary | ICD-10-CM | POA: Diagnosis not present

## 2016-06-07 DIAGNOSIS — E785 Hyperlipidemia, unspecified: Secondary | ICD-10-CM | POA: Diagnosis not present

## 2016-06-07 DIAGNOSIS — M810 Age-related osteoporosis without current pathological fracture: Secondary | ICD-10-CM | POA: Diagnosis not present

## 2016-06-07 DIAGNOSIS — E1122 Type 2 diabetes mellitus with diabetic chronic kidney disease: Secondary | ICD-10-CM | POA: Diagnosis not present

## 2016-06-07 DIAGNOSIS — Z23 Encounter for immunization: Secondary | ICD-10-CM | POA: Diagnosis not present

## 2016-06-12 ENCOUNTER — Other Ambulatory Visit: Payer: Self-pay

## 2016-06-12 DIAGNOSIS — Z1231 Encounter for screening mammogram for malignant neoplasm of breast: Secondary | ICD-10-CM

## 2016-06-14 DIAGNOSIS — N183 Chronic kidney disease, stage 3 (moderate): Secondary | ICD-10-CM | POA: Diagnosis not present

## 2016-06-14 DIAGNOSIS — I209 Angina pectoris, unspecified: Secondary | ICD-10-CM | POA: Diagnosis not present

## 2016-06-14 DIAGNOSIS — I129 Hypertensive chronic kidney disease with stage 1 through stage 4 chronic kidney disease, or unspecified chronic kidney disease: Secondary | ICD-10-CM | POA: Diagnosis not present

## 2016-06-14 DIAGNOSIS — E1122 Type 2 diabetes mellitus with diabetic chronic kidney disease: Secondary | ICD-10-CM | POA: Diagnosis not present

## 2016-06-14 DIAGNOSIS — F17211 Nicotine dependence, cigarettes, in remission: Secondary | ICD-10-CM | POA: Diagnosis not present

## 2016-06-14 DIAGNOSIS — N390 Urinary tract infection, site not specified: Secondary | ICD-10-CM | POA: Diagnosis not present

## 2016-06-24 DIAGNOSIS — M545 Low back pain: Secondary | ICD-10-CM | POA: Diagnosis not present

## 2016-06-24 DIAGNOSIS — M6281 Muscle weakness (generalized): Secondary | ICD-10-CM | POA: Diagnosis not present

## 2016-06-24 DIAGNOSIS — M546 Pain in thoracic spine: Secondary | ICD-10-CM | POA: Diagnosis not present

## 2016-06-24 DIAGNOSIS — R262 Difficulty in walking, not elsewhere classified: Secondary | ICD-10-CM | POA: Diagnosis not present

## 2016-06-25 ENCOUNTER — Ambulatory Visit
Admission: RE | Admit: 2016-06-25 | Discharge: 2016-06-25 | Disposition: A | Discharge status: Home / Self Care | Payer: Medicare Other | Source: Ambulatory Visit

## 2016-06-25 DIAGNOSIS — Z1231 Encounter for screening mammogram for malignant neoplasm of breast: Secondary | ICD-10-CM

## 2016-07-01 DIAGNOSIS — M6281 Muscle weakness (generalized): Secondary | ICD-10-CM | POA: Diagnosis not present

## 2016-07-01 DIAGNOSIS — M546 Pain in thoracic spine: Secondary | ICD-10-CM | POA: Diagnosis not present

## 2016-07-01 DIAGNOSIS — R262 Difficulty in walking, not elsewhere classified: Secondary | ICD-10-CM | POA: Diagnosis not present

## 2016-07-01 DIAGNOSIS — M545 Low back pain: Secondary | ICD-10-CM | POA: Diagnosis not present

## 2016-08-07 DIAGNOSIS — M9903 Segmental and somatic dysfunction of lumbar region: Secondary | ICD-10-CM | POA: Diagnosis not present

## 2016-08-07 DIAGNOSIS — M9904 Segmental and somatic dysfunction of sacral region: Secondary | ICD-10-CM | POA: Diagnosis not present

## 2016-08-07 DIAGNOSIS — M9905 Segmental and somatic dysfunction of pelvic region: Secondary | ICD-10-CM | POA: Diagnosis not present

## 2016-08-07 DIAGNOSIS — M9902 Segmental and somatic dysfunction of thoracic region: Secondary | ICD-10-CM | POA: Diagnosis not present

## 2016-08-12 DIAGNOSIS — H40013 Open angle with borderline findings, low risk, bilateral: Secondary | ICD-10-CM | POA: Diagnosis not present

## 2016-08-12 DIAGNOSIS — E119 Type 2 diabetes mellitus without complications: Secondary | ICD-10-CM | POA: Diagnosis not present

## 2016-08-12 DIAGNOSIS — H353110 Nonexudative age-related macular degeneration, right eye, stage unspecified: Secondary | ICD-10-CM | POA: Diagnosis not present

## 2016-08-12 DIAGNOSIS — H35033 Hypertensive retinopathy, bilateral: Secondary | ICD-10-CM | POA: Diagnosis not present

## 2016-12-11 DIAGNOSIS — I129 Hypertensive chronic kidney disease with stage 1 through stage 4 chronic kidney disease, or unspecified chronic kidney disease: Secondary | ICD-10-CM | POA: Diagnosis not present

## 2016-12-11 DIAGNOSIS — E1122 Type 2 diabetes mellitus with diabetic chronic kidney disease: Secondary | ICD-10-CM | POA: Diagnosis not present

## 2016-12-11 DIAGNOSIS — M810 Age-related osteoporosis without current pathological fracture: Secondary | ICD-10-CM | POA: Diagnosis not present

## 2016-12-17 DIAGNOSIS — I509 Heart failure, unspecified: Secondary | ICD-10-CM | POA: Diagnosis not present

## 2016-12-17 DIAGNOSIS — E1122 Type 2 diabetes mellitus with diabetic chronic kidney disease: Secondary | ICD-10-CM | POA: Diagnosis not present

## 2016-12-17 DIAGNOSIS — I129 Hypertensive chronic kidney disease with stage 1 through stage 4 chronic kidney disease, or unspecified chronic kidney disease: Secondary | ICD-10-CM | POA: Diagnosis not present

## 2016-12-17 DIAGNOSIS — Z23 Encounter for immunization: Secondary | ICD-10-CM | POA: Diagnosis not present

## 2016-12-17 DIAGNOSIS — I2510 Atherosclerotic heart disease of native coronary artery without angina pectoris: Secondary | ICD-10-CM | POA: Diagnosis not present

## 2017-03-04 DIAGNOSIS — H532 Diplopia: Secondary | ICD-10-CM | POA: Diagnosis not present

## 2017-03-04 DIAGNOSIS — H40013 Open angle with borderline findings, low risk, bilateral: Secondary | ICD-10-CM | POA: Diagnosis not present

## 2017-03-04 DIAGNOSIS — H04123 Dry eye syndrome of bilateral lacrimal glands: Secondary | ICD-10-CM | POA: Diagnosis not present

## 2017-03-19 DIAGNOSIS — R110 Nausea: Secondary | ICD-10-CM | POA: Diagnosis not present

## 2017-03-19 DIAGNOSIS — R197 Diarrhea, unspecified: Secondary | ICD-10-CM | POA: Diagnosis not present

## 2017-06-16 DIAGNOSIS — E559 Vitamin D deficiency, unspecified: Secondary | ICD-10-CM | POA: Diagnosis not present

## 2017-06-16 DIAGNOSIS — E1122 Type 2 diabetes mellitus with diabetic chronic kidney disease: Secondary | ICD-10-CM | POA: Diagnosis not present

## 2017-06-16 DIAGNOSIS — I129 Hypertensive chronic kidney disease with stage 1 through stage 4 chronic kidney disease, or unspecified chronic kidney disease: Secondary | ICD-10-CM | POA: Diagnosis not present

## 2017-06-16 DIAGNOSIS — E785 Hyperlipidemia, unspecified: Secondary | ICD-10-CM | POA: Diagnosis not present

## 2017-06-16 DIAGNOSIS — Z0000 Encounter for general adult medical examination without abnormal findings: Secondary | ICD-10-CM | POA: Diagnosis not present

## 2017-06-23 DIAGNOSIS — Z1212 Encounter for screening for malignant neoplasm of rectum: Secondary | ICD-10-CM | POA: Diagnosis not present

## 2017-06-23 DIAGNOSIS — I10 Essential (primary) hypertension: Secondary | ICD-10-CM | POA: Diagnosis not present

## 2017-06-23 DIAGNOSIS — E1122 Type 2 diabetes mellitus with diabetic chronic kidney disease: Secondary | ICD-10-CM | POA: Diagnosis not present

## 2017-06-23 DIAGNOSIS — N390 Urinary tract infection, site not specified: Secondary | ICD-10-CM | POA: Diagnosis not present

## 2017-06-23 DIAGNOSIS — E7800 Pure hypercholesterolemia, unspecified: Secondary | ICD-10-CM | POA: Diagnosis not present

## 2017-06-23 DIAGNOSIS — Z0000 Encounter for general adult medical examination without abnormal findings: Secondary | ICD-10-CM | POA: Diagnosis not present

## 2017-07-09 DIAGNOSIS — M9902 Segmental and somatic dysfunction of thoracic region: Secondary | ICD-10-CM | POA: Diagnosis not present

## 2017-07-09 DIAGNOSIS — M9903 Segmental and somatic dysfunction of lumbar region: Secondary | ICD-10-CM | POA: Diagnosis not present

## 2017-07-09 DIAGNOSIS — M9905 Segmental and somatic dysfunction of pelvic region: Secondary | ICD-10-CM | POA: Diagnosis not present

## 2017-07-09 DIAGNOSIS — M9904 Segmental and somatic dysfunction of sacral region: Secondary | ICD-10-CM | POA: Diagnosis not present

## 2017-07-21 DIAGNOSIS — M9904 Segmental and somatic dysfunction of sacral region: Secondary | ICD-10-CM | POA: Diagnosis not present

## 2017-07-21 DIAGNOSIS — M9902 Segmental and somatic dysfunction of thoracic region: Secondary | ICD-10-CM | POA: Diagnosis not present

## 2017-07-21 DIAGNOSIS — M9903 Segmental and somatic dysfunction of lumbar region: Secondary | ICD-10-CM | POA: Diagnosis not present

## 2017-07-21 DIAGNOSIS — M9905 Segmental and somatic dysfunction of pelvic region: Secondary | ICD-10-CM | POA: Diagnosis not present

## 2017-07-29 DIAGNOSIS — M9904 Segmental and somatic dysfunction of sacral region: Secondary | ICD-10-CM | POA: Diagnosis not present

## 2017-07-29 DIAGNOSIS — M9905 Segmental and somatic dysfunction of pelvic region: Secondary | ICD-10-CM | POA: Diagnosis not present

## 2017-07-29 DIAGNOSIS — M9902 Segmental and somatic dysfunction of thoracic region: Secondary | ICD-10-CM | POA: Diagnosis not present

## 2017-07-29 DIAGNOSIS — M9903 Segmental and somatic dysfunction of lumbar region: Secondary | ICD-10-CM | POA: Diagnosis not present

## 2017-08-01 DIAGNOSIS — M9904 Segmental and somatic dysfunction of sacral region: Secondary | ICD-10-CM | POA: Diagnosis not present

## 2017-08-01 DIAGNOSIS — M9903 Segmental and somatic dysfunction of lumbar region: Secondary | ICD-10-CM | POA: Diagnosis not present

## 2017-08-01 DIAGNOSIS — M9905 Segmental and somatic dysfunction of pelvic region: Secondary | ICD-10-CM | POA: Diagnosis not present

## 2017-08-01 DIAGNOSIS — M9902 Segmental and somatic dysfunction of thoracic region: Secondary | ICD-10-CM | POA: Diagnosis not present

## 2017-08-14 DIAGNOSIS — I10 Essential (primary) hypertension: Secondary | ICD-10-CM | POA: Diagnosis not present

## 2017-08-14 DIAGNOSIS — E213 Hyperparathyroidism, unspecified: Secondary | ICD-10-CM | POA: Diagnosis not present

## 2017-08-14 DIAGNOSIS — M8580 Other specified disorders of bone density and structure, unspecified site: Secondary | ICD-10-CM | POA: Diagnosis not present

## 2017-08-17 ENCOUNTER — Emergency Department (HOSPITAL_COMMUNITY)
Admission: EM | Admit: 2017-08-17 | Discharge: 2017-08-17 | Disposition: A | Discharge status: Home / Self Care | Payer: Medicare Other | Attending: Emergency Medicine | Admitting: Emergency Medicine

## 2017-08-17 ENCOUNTER — Encounter (HOSPITAL_COMMUNITY): Payer: Self-pay

## 2017-08-17 ENCOUNTER — Emergency Department (HOSPITAL_COMMUNITY): Payer: Medicare Other

## 2017-08-17 DIAGNOSIS — Y999 Unspecified external cause status: Secondary | ICD-10-CM | POA: Insufficient documentation

## 2017-08-17 DIAGNOSIS — Y9301 Activity, walking, marching and hiking: Secondary | ICD-10-CM | POA: Diagnosis not present

## 2017-08-17 DIAGNOSIS — Z7982 Long term (current) use of aspirin: Secondary | ICD-10-CM | POA: Insufficient documentation

## 2017-08-17 DIAGNOSIS — I10 Essential (primary) hypertension: Secondary | ICD-10-CM | POA: Insufficient documentation

## 2017-08-17 DIAGNOSIS — S2242XA Multiple fractures of ribs, left side, initial encounter for closed fracture: Secondary | ICD-10-CM | POA: Diagnosis not present

## 2017-08-17 DIAGNOSIS — W01190A Fall on same level from slipping, tripping and stumbling with subsequent striking against furniture, initial encounter: Secondary | ICD-10-CM | POA: Insufficient documentation

## 2017-08-17 DIAGNOSIS — Y92009 Unspecified place in unspecified non-institutional (private) residence as the place of occurrence of the external cause: Secondary | ICD-10-CM | POA: Insufficient documentation

## 2017-08-17 DIAGNOSIS — Z87891 Personal history of nicotine dependence: Secondary | ICD-10-CM | POA: Diagnosis not present

## 2017-08-17 DIAGNOSIS — E119 Type 2 diabetes mellitus without complications: Secondary | ICD-10-CM | POA: Insufficient documentation

## 2017-08-17 DIAGNOSIS — Z79899 Other long term (current) drug therapy: Secondary | ICD-10-CM | POA: Diagnosis not present

## 2017-08-17 DIAGNOSIS — R42 Dizziness and giddiness: Secondary | ICD-10-CM | POA: Insufficient documentation

## 2017-08-17 DIAGNOSIS — I2510 Atherosclerotic heart disease of native coronary artery without angina pectoris: Secondary | ICD-10-CM | POA: Insufficient documentation

## 2017-08-17 DIAGNOSIS — Z7984 Long term (current) use of oral hypoglycemic drugs: Secondary | ICD-10-CM | POA: Diagnosis not present

## 2017-08-17 DIAGNOSIS — W19XXXA Unspecified fall, initial encounter: Secondary | ICD-10-CM

## 2017-08-17 DIAGNOSIS — S299XXA Unspecified injury of thorax, initial encounter: Secondary | ICD-10-CM | POA: Diagnosis present

## 2017-08-17 MED ORDER — OXYCODONE-ACETAMINOPHEN 5-325 MG PO TABS
1.0000 | ORAL_TABLET | Freq: Once | ORAL | Status: AC
  Administered 2017-08-17: 1 via ORAL
  Filled 2017-08-17: qty 1

## 2017-08-17 MED ORDER — OXYCODONE-ACETAMINOPHEN 5-325 MG PO TABS: 1.0000 | ORAL_TABLET | Freq: Four times a day (QID) | ORAL | 0 refills | Status: DC | PRN

## 2017-08-17 NOTE — Progress Notes (Signed)
CSW contacted by patient's RN and informed that a social work consult was placed from home health services for patient. CSW informed patient's RN that [facility], CSW agreed to notify RNCM.   CSW notified RNCM Alesia via text message and called and left a voicemail. CSW signing off, no other needs identified at this time. Please consult if new needs arise.   Celso SickleKimberly Aidenjames Heckmann, LCSWA [facility]  Clinical Social Worker [phone]

## 2017-08-17 NOTE — Discharge Instructions (Signed)
Please read attached information regarding your condition. Use incentive spirometer as directed. Take pain medication as directed. [facility]  Return to ED for any chest pain, trouble breathing, productive cough, additional falls, head injuries, vision changes, numbness, weakness.

## 2017-08-17 NOTE — ED Provider Notes (Signed)
[facility]-EMERGENCY DEPT Provider Note   CSN: [id number] Arrival date & time: 08/17/17  0533     History   Chief Complaint Chief Complaint  Patient presents with  . Fall    HPI Angela Craig is a 83 y.o. female.  HPI  Patient, with a past medical history of CAD, DM, HTN, presents to ED for evaluation of mechanical fall that occurred PTA. She states she was walking up to the bathroom and fell to the side of her dresser, and hit her left back. She reports pain on the left back side. Denies head injury, LOC, use of blood thinners. She denies any chest pain, trouble breathing, vision changes, nausea, vomiting, numbness.  Past Medical History:  Diagnosis Date  . CAD (coronary artery disease)    Non-critical by catheterization on April 27,2006 with normal LV function--last echocardiogram in 2003 which showed normal LVEF--last nuclear stress in August 2010 showed no significant ischemia with an EF of 70%  . Diabetes mellitus (HCC)   . Dizziness   . Hyperlipidemia   . Hypertension   . Obstructive sleep apnea    followed by Dr. Tresa Endo    There are no active problems to display for this patient.   Past Surgical History:  Procedure Laterality Date  . APPENDECTOMY    . CARDIAC CATHETERIZATION    . CHOLECYSTECTOMY    . FOOT SURGERY    . TUMOR REMOVAL      OB History    No data available       Home Medications    Prior to Admission medications   Medication Sig Start Date End Date Taking? Authorizing Provider  aspirin 81 MG tablet Take 81 mg by mouth daily.   Yes [provider]  benazepril (LOTENSIN) 40 MG tablet Take 40 mg by mouth daily.  09/11/15  Yes [provider]  Cholecalciferol (VITAMIN D3) 2000 UNITS TABS Take 2,000 Units by mouth daily.    Yes [provider]  clobetasol cream (TEMOVATE) 0.05 % Apply 1 application topically daily as needed (eczema).  06/23/17  Yes [provider]  esomeprazole (NEXIUM) 40 MG capsule Take 40 mg by  mouth daily at 12 noon.   Yes [provider]  fluticasone (FLONASE) 50 MCG/ACT nasal spray Place into both nostrils daily.   Yes [provider]  Liraglutide (VICTOZA) 18 MG/3ML SOPN Inject 1.2 mg into the skin.    Yes [provider]  metFORMIN (GLUCOPHAGE) 500 MG tablet Take 500 mg by mouth every evening.   Yes [provider]  metoprolol (LOPRESSOR) 50 MG tablet Take 50 mg by mouth 2 (two) times daily.    Yes [provider]  Multiple Vitamin (MULTIVITAMIN) tablet Take 1 tablet by mouth daily.   Yes [provider]  simvastatin (ZOCOR) 80 MG tablet Take 40 mg by mouth daily.    Yes [provider]  oxyCODONE-acetaminophen (PERCOCET/ROXICET) 5-325 MG tablet Take 1 tablet by mouth every 6 (six) hours as needed for severe pain. 08/17/17   Dietrich Pates, PA-C    Family History Family History  Problem Relation Age of Onset  . Diabetes Mother   . Heart attack Mother   . Heart disease Father   . Cancer Sister   . Cancer Brother   . Diabetes Brother   . Diabetes Sister     Social History Social History  Substance Use Topics  . Smoking status: Former Games developer  . Smokeless tobacco: Current User     Comment:  Quit 2014  . Alcohol use No     Allergies   Iodine; Novocain [procaine]; and Neosporin [neomycin-bacitracin zn-polymyx]   Review of Systems Review of Systems  Constitutional: Negative for appetite change, chills and fever.  HENT: Negative for ear pain, rhinorrhea, sneezing and sore throat.   Eyes: Negative for photophobia and visual disturbance.  Respiratory: Negative for cough, chest tightness, shortness of breath and wheezing.   Cardiovascular: Negative for chest pain and palpitations.  Gastrointestinal: Negative for abdominal pain, blood in stool, constipation, diarrhea, nausea and vomiting.  Genitourinary: Negative for dysuria, hematuria and urgency.  Musculoskeletal: Positive for arthralgias. Negative for  myalgias.       L rib pain  Skin: Positive for wound. Negative for rash.  Neurological: Negative for dizziness, weakness and light-headedness.     Physical Exam Updated Vital Signs BP (!) 133/50   Pulse 64   Temp (!) 97.4 F (36.3 C) (Oral)   Resp 16   Ht 5\' 8"  (1.727 m)   Wt 84.4 kg (186 lb)   SpO2 94%   BMI 28.28 kg/m   Physical Exam  Constitutional: She appears well-developed and well-nourished. No distress.  HENT:  Head: Normocephalic and atraumatic.  Nose: Nose normal.  Eyes: Conjunctivae and EOM are normal. Left eye exhibits no discharge. No scleral icterus.  Neck: Normal range of motion. Neck supple.  Cardiovascular: Normal rate, regular rhythm, normal heart sounds and intact distal pulses.  Exam reveals no gallop and no friction rub.   No murmur heard. Pulmonary/Chest: Effort normal and breath sounds normal. No respiratory distress.      Abdominal: Soft. Bowel sounds are normal. She exhibits no distension. There is no tenderness. There is no guarding.  Musculoskeletal: Normal range of motion. She exhibits tenderness. She exhibits no edema.  Neurological: She is alert. She exhibits normal muscle tone. Coordination normal.  Skin: Skin is warm and dry. No rash noted.  Psychiatric: She has a normal mood and affect.  Nursing note and vitals reviewed.    ED Treatments / Results  Labs (all labs ordered are listed, but only abnormal results are displayed) Labs Reviewed - No data to display  EKG  EKG Interpretation None       Radiology Dg Ribs Unilateral W/chest Left  Result Date: 08/17/2017 CLINICAL DATA:  83 y/o F; fall with injury to the left lateral and upper posterior ribs. EXAM: LEFT RIBS AND CHEST - 3+ VIEW COMPARISON:  07/15/2009 chest radiograph FINDINGS: Stable normal cardiac silhouette. Aortic atherosclerosis with calcification. Clear lungs. No pneumothorax or effusion. Mildly displaced left seventh and eighth lateral rib fractures. IMPRESSION:  Mildly displaced left seventh and eighth lateral rib fractures. No pneumothorax. Electronically Signed   By: Mitzi Hansen M.D.   On: 08/17/2017 06:24   Ct Head Wo Contrast  Result Date: 08/17/2017 CLINICAL DATA:  83 year old who fell out of bed and struck a side table this morning. Initial encounter. EXAM: CT HEAD WITHOUT CONTRAST TECHNIQUE: Contiguous axial images were obtained from the base of the skull through the vertex without intravenous contrast. COMPARISON:  MRI brain 10/24/2015. FINDINGS: Brain: Mild age related cortical and deep atrophy. Moderate changes of small vessel disease of the white matter diffusely. No mass lesion. No midline shift. No acute hemorrhage or hematoma. No extra-axial fluid collections. No evidence of acute infarction. No interval change since the prior MRI. Vascular: Moderate bilateral carotid siphon and bilateral vertebral artery atherosclerosis. Skull: No skull fracture or other focal osseous abnormality involving the skull. Sinuses/Orbits:  Visualized paranasal sinuses, bilateral mastoid air cells and bilateral middle ear cavities well-aerated. Visualized orbits and globes are normal. Other: None. IMPRESSION: 1. No acute intracranial abnormality. 2. Stable mild age related cortical and deep atrophy. Stable moderate chronic microvascular ischemic changes of the white matter. Electronically Signed   By: Hulan Saas M.D.   On: 08/17/2017 07:44   Ct Chest Wo Contrast  Result Date: 08/17/2017 CLINICAL DATA:  Left rib pain after falling out of bed and hitting the side of the table. EXAM: CT CHEST WITHOUT CONTRAST TECHNIQUE: Multidetector CT imaging of the chest was performed following the standard protocol without IV contrast. COMPARISON:  Chest and left rib radiographs dated 08/17/2017. FINDINGS: Cardiovascular: Dense atheromatous arterial calcifications, including the thoracic aorta and coronary arteries. Normal sized heart. Mediastinum/Nodes: No enlarged lymph  nodes. Multiple bilateral thyroid nodules. The largest is on the left, measuring 1.6 cm in maximum diameter. Small to moderate-sized hiatal hernia. Lungs/Pleura: 1.6 x 1.3 x 1.2 cm ground-glass nodule in the left lower lobe measured on axial image number 50 of series 5 and coronal image number 80. This is oval in shape with irregular margins. No other nodules are seen. There is a small area of focal atelectasis or scarring in the superior aspect of the right lower lobe, posteriorly, adjacent to the major fissure. Mild bilateral bullous changes are noted. No pleural fluid or pneumothorax. Upper Abdomen: Atheromatous arterial calcifications. Musculoskeletal: Displaced left seventh and eighth lateral rib fractures. Old, healed left tenth and eleventh rib fractures. Minimal thoracic spine degenerative changes. IMPRESSION: 1. **An incidental finding of potential clinical significance has been found. There is a 1.6 x 1.3 x 1.2 cm ground-glass nodule in the left lower lobe. This could be infectious or neoplastic. Initial follow-up with CT at 6-12 months is recommended to confirm persistence. If persistent, repeat CT is recommended every 2 years until 5 years of stability has been established. This recommendation follows the consensus statement: Guidelines for Management of Incidental Pulmonary Nodules Detected on CT Images: From the Fleischner Society 2017; Radiology 2017; 284:228-243. 2. Displaced left seventh and eighth lateral rib fractures without pneumothorax. 3. Mild changes of COPD with predominantly paraseptal components and some centrilobular components. 4. Densely calcified coronary artery and aortic atherosclerosis. 5. Multiple bilateral thyroid nodules, the largest measuring 1.6 cm. Consider further evaluation with thyroid ultrasound. If patient is clinically hyperthyroid, consider nuclear medicine thyroid uptake and scan. Aortic Atherosclerosis (ICD10-I70.0) and Emphysema (ICD10-J43.9). Electronically Signed    By: Beckie Salts M.D.   On: 08/17/2017 08:04    Procedures Procedures (including critical care time)  Medications Ordered in ED Medications  oxyCODONE-acetaminophen (PERCOCET/ROXICET) 5-325 MG per tablet 1 tablet (1 tablet Oral Given 08/17/17 0742)     Initial Impression / Assessment and Plan / ED Course  I have reviewed the triage vital signs and the nursing notes.  Pertinent labs & imaging results that were available during my care of the patient were reviewed by me and considered in my medical decision making (see chart for details).     Patient presents to ED for evaluation of L back-rib pain since mechanical fall yesterday. She denies head injury, LOC, headache, vision changes, use of blood thinners. Pain worse with movement and inspiration. CXR showed displaced left seventh and eighth lateral rib fractures with no pneumothorax. CT chest confirmed these findings, but did find incidental nodule in left lower lobe. She denies any cough, fevers or chills that would suggest pneumonia. Head CT negative. Patient reports pain much  improved with Percocet. Oxygen saturations 94-98% on room air. We will ambulate patient with pulse ox.  1016: Patient ambulated in whole with oxygen saturations staying at 98% on room air. We will discharge patient home with pain medication and incentive spirometer. No admission warranted at this time based on achieving pain control and ambulation with good pulse ox.  Patient discussed with and seen by Dr. Rush Landmarkegeler.  Final Clinical Impressions(s) / ED Diagnoses   Final diagnoses:  Closed fracture of multiple ribs of left side, initial encounter  Fall, initial encounter    New Prescriptions Discharge Medication List as of 08/17/2017 11:48 AM    START taking these medications   Details  oxyCODONE-acetaminophen (PERCOCET/ROXICET) 5-325 MG tablet Take 1 tablet by mouth every 6 (six) hours as needed for severe pain., Starting Sun 08/17/2017, Print           Leona ValleyKhatri, Middleburg[place], PA-C 08/17/17 1624    Tegeler, Canary Brimhristopher J, MD 08/19/17 1110

## 2017-08-17 NOTE — ED Notes (Signed)
Social work call regarding patient  And she is going to call came Production designer, theatre/television/filmmanager to speak to the family.

## 2017-08-17 NOTE — ED Notes (Signed)
Bed: [id number] Expected date:  Expected time:  Means of arrival:  Comments: 83 yo F/Fall

## 2017-08-17 NOTE — ED Triage Notes (Signed)
Fell off of bed and hit side table with left rib area now painful no LOC no other complaints voiced alert and oriented x 3 no respiratory or acute distress noted.

## 2017-08-17 NOTE — ED Notes (Signed)
Patient ambulated in the hall and her oxygen sat stay at 98% on room air.

## 2017-08-17 NOTE — ED Notes (Signed)
Teaching done on incentive spirometry done with the patient pt was able to do 1500 ml. Pt verbalized understanding and demonstrated the use of the incentive spirometry. Pt son was present and explain to him to encouraged her at home to do IS.

## 2017-08-19 ENCOUNTER — Telehealth: Payer: Self-pay | Admitting: Emergency Medicine

## 2017-08-19 ENCOUNTER — Other Ambulatory Visit: Payer: Self-pay | Admitting: *Deleted

## 2017-08-19 NOTE — Progress Notes (Addendum)
08/19/2017 Orders in Epic correctly. [facility] [place] [place] and they follow will contact dtr. Isidoro DonningAlesia Mikeyla Music RN CCM Case Mgmt phone [phone]  08/17/2017 Pt was dc prior to CM following up. Spoke to pt's daughter, Lennette BihariDawn Ray [phone] via phone [facility]. Offered choice for HH/dtr requested [facility]. Pt has RW at home. Orders were incomplete. Isidoro DonningAlesia Nithya Meriweather RN CCM Case Mgmt phone [phone]

## 2017-08-19 NOTE — Telephone Encounter (Signed)
Spoke with Angela Craig who stated their first choice is [facility] and [facility].  Advised CM would contact them with the agency information that accepts the pt for services.  Contacted Tim, rep with [facility].

## 2017-08-19 NOTE — Telephone Encounter (Signed)
CM consulted from 08/17/2017 for [facility].  Noted incomplete orders.  Waited for EDP to come on at 3:00 pm today to correct orders before contacting pt and family.  Called pt and spoke with daughter, Angela Craig and pt who advised pt's other daughter, Angela Craig, [phone] works for a [facility] and I would need to call her to get information.

## 2017-08-19 NOTE — Telephone Encounter (Signed)
Left VM for Angela Craig to call [facility] back with [facility].

## 2017-08-19 NOTE — Telephone Encounter (Signed)
Advised Angela Craig that I was not able to get in touch with Angela Craig.  Asked if there was any other phone numbers that CM could try to get in touch with her.  Angela Craig said she would call me back.

## 2017-08-19 NOTE — Telephone Encounter (Signed)
Angela Craig stated she was not able to get in touch with [name].  Provided a list of HH Agencies for them to discuss and call back with their first and second choices.

## 2017-08-19 NOTE — Patient Outreach (Signed)
Incoming call from a personal acquaintance whose mother had a fall and an ED visit over the weekend. She asked if her mother would be a candidate for our services. Angela Craig, pt's daughter, reports she has requested a referral for home health services: nursing aide and PT. I have advised Angela that if her mother is going to receive those services, then [facility] at this [facility]. Angela is on vacation at this time and usually is at home caring for her mother. Her mother is being cared for by other family members this week. I did give Angela some pointers on keeping her mother moving, using a pillow to guard her chest when she moves, coughs, laughs to minimize her pain. Also to make sure she is having a regular BM if opiates were prescribed for pain relief.   Zara Councilarroll C. Burgess EstelleSpinks, MSN, [facility]NP-BC Gerontological Nurse Practitioner [facility] Care Management [phone]

## 2017-08-19 NOTE — Telephone Encounter (Signed)
Cathlean CowerAlesia, CM, contacted this CM stating that she had already spoken with the family over the weekend but had not put a note in the computer.  She advised [facility].  Advised her that I had Dr Rush Landmarkegeler [facility], PT, OT, and NA and place the Face to Face order today.  Advised Tim, rep with [facility] that pt would not need their services.  No further CM needs noted at this time.

## 2017-09-27 ENCOUNTER — Observation Stay (HOSPITAL_COMMUNITY)
Admission: EM | Admit: 2017-09-27 | Discharge: 2017-09-28 | Disposition: A | Discharge status: Home / Self Care | Payer: Medicare Other | Attending: Internal Medicine | Admitting: Internal Medicine

## 2017-09-27 ENCOUNTER — Emergency Department (HOSPITAL_COMMUNITY): Payer: Medicare Other

## 2017-09-27 ENCOUNTER — Encounter (HOSPITAL_COMMUNITY): Payer: Self-pay

## 2017-09-27 DIAGNOSIS — Y939 Activity, unspecified: Secondary | ICD-10-CM | POA: Diagnosis not present

## 2017-09-27 DIAGNOSIS — E119 Type 2 diabetes mellitus without complications: Secondary | ICD-10-CM | POA: Insufficient documentation

## 2017-09-27 DIAGNOSIS — W010XXA Fall on same level from slipping, tripping and stumbling without subsequent striking against object, initial encounter: Secondary | ICD-10-CM | POA: Insufficient documentation

## 2017-09-27 DIAGNOSIS — Z7982 Long term (current) use of aspirin: Secondary | ICD-10-CM | POA: Insufficient documentation

## 2017-09-27 DIAGNOSIS — I10 Essential (primary) hypertension: Secondary | ICD-10-CM | POA: Diagnosis not present

## 2017-09-27 DIAGNOSIS — S299XXA Unspecified injury of thorax, initial encounter: Secondary | ICD-10-CM | POA: Diagnosis present

## 2017-09-27 DIAGNOSIS — E785 Hyperlipidemia, unspecified: Secondary | ICD-10-CM | POA: Insufficient documentation

## 2017-09-27 DIAGNOSIS — Z87891 Personal history of nicotine dependence: Secondary | ICD-10-CM | POA: Diagnosis not present

## 2017-09-27 DIAGNOSIS — Y999 Unspecified external cause status: Secondary | ICD-10-CM | POA: Insufficient documentation

## 2017-09-27 DIAGNOSIS — Z7984 Long term (current) use of oral hypoglycemic drugs: Secondary | ICD-10-CM | POA: Diagnosis not present

## 2017-09-27 DIAGNOSIS — S2241XA Multiple fractures of ribs, right side, initial encounter for closed fracture: Principal | ICD-10-CM | POA: Insufficient documentation

## 2017-09-27 DIAGNOSIS — R262 Difficulty in walking, not elsewhere classified: Secondary | ICD-10-CM | POA: Insufficient documentation

## 2017-09-27 DIAGNOSIS — Z79899 Other long term (current) drug therapy: Secondary | ICD-10-CM | POA: Insufficient documentation

## 2017-09-27 DIAGNOSIS — Y929 Unspecified place or not applicable: Secondary | ICD-10-CM | POA: Insufficient documentation

## 2017-09-27 DIAGNOSIS — I2510 Atherosclerotic heart disease of native coronary artery without angina pectoris: Secondary | ICD-10-CM | POA: Diagnosis not present

## 2017-09-27 DIAGNOSIS — R52 Pain, unspecified: Secondary | ICD-10-CM | POA: Diagnosis present

## 2017-09-27 LAB — URINALYSIS, ROUTINE W REFLEX MICROSCOPIC
Bilirubin Urine: NEGATIVE
Glucose, UA: NEGATIVE mg/dL
Hgb urine dipstick: NEGATIVE
Ketones, ur: NEGATIVE mg/dL
Nitrite: NEGATIVE
Protein, ur: 100 mg/dL — AB
Specific Gravity, Urine: 1.013 (ref 1.005–1.030)
pH: 5 (ref 5.0–8.0)

## 2017-09-27 LAB — CBC WITH DIFFERENTIAL/PLATELET
Basophils Absolute: 0 10*3/uL (ref 0.0–0.1)
Basophils Relative: 0 %
Eosinophils Absolute: 0.1 10*3/uL (ref 0.0–0.7)
Eosinophils Relative: 1 %
HCT: 34.9 % — ABNORMAL LOW (ref 36.0–46.0)
Hemoglobin: 10.9 g/dL — ABNORMAL LOW (ref 12.0–15.0)
Lymphocytes Relative: 17 %
Lymphs Abs: 1.3 10*3/uL (ref 0.7–4.0)
MCH: 27.9 pg (ref 26.0–34.0)
MCHC: 31.2 g/dL (ref 30.0–36.0)
MCV: 89.3 fL (ref 78.0–100.0)
Monocytes Absolute: 0.5 10*3/uL (ref 0.1–1.0)
Monocytes Relative: 6 %
Neutro Abs: 5.6 10*3/uL (ref 1.7–7.7)
Neutrophils Relative %: 76 %
Platelets: 246 10*3/uL (ref 150–400)
RBC: 3.91 MIL/uL (ref 3.87–5.11)
RDW: 14.6 % (ref 11.5–15.5)
WBC: 7.5 10*3/uL (ref 4.0–10.5)

## 2017-09-27 LAB — BASIC METABOLIC PANEL
Anion gap: 8 (ref 5–15)
BUN: 19 mg/dL (ref 6–20)
CO2: 25 mmol/L (ref 22–32)
Calcium: 10 mg/dL (ref 8.9–10.3)
Chloride: 105 mmol/L (ref 101–111)
Creatinine, Ser: 0.9 mg/dL (ref 0.44–1.00)
GFR calc Af Amer: >60 mL/min (ref >60)
GFR calc non Af Amer: 58 mL/min — ABNORMAL LOW (ref >60)
Glucose, Bld: 159 mg/dL — ABNORMAL HIGH (ref 65–99)
Potassium: 3.6 mmol/L (ref 3.5–5.1)
Sodium: 138 mmol/L (ref 135–145)

## 2017-09-27 MED ORDER — PANTOPRAZOLE SODIUM 40 MG PO TBEC
40.0000 mg | DELAYED_RELEASE_TABLET | Freq: Every day | ORAL | Status: DC
  Administered 2017-09-28: 40 mg via ORAL
  Filled 2017-09-27: qty 1

## 2017-09-27 MED ORDER — BENAZEPRIL HCL 10 MG PO TABS
40.0000 mg | ORAL_TABLET | Freq: Every day | ORAL | Status: DC
  Administered 2017-09-28: 40 mg via ORAL
  Filled 2017-09-27: qty 4

## 2017-09-27 MED ORDER — METFORMIN HCL 500 MG PO TABS: 500.0000 mg | ORAL_TABLET | Freq: Every evening | ORAL | Status: DC

## 2017-09-27 MED ORDER — ADULT MULTIVITAMIN W/MINERALS CH
1.0000 | ORAL_TABLET | Freq: Every day | ORAL | Status: DC
  Administered 2017-09-28: 1 via ORAL
  Filled 2017-09-27: qty 1

## 2017-09-27 MED ORDER — HYDROCODONE-ACETAMINOPHEN 5-325 MG PO TABS
1.0000 | ORAL_TABLET | Freq: Once | ORAL | Status: AC
  Administered 2017-09-27: 1 via ORAL
  Filled 2017-09-27: qty 1

## 2017-09-27 MED ORDER — VITAMIN D 1000 UNITS PO TABS
2000.0000 [IU] | ORAL_TABLET | Freq: Every day | ORAL | Status: DC
  Administered 2017-09-28: 2000 [IU] via ORAL
  Filled 2017-09-27: qty 2

## 2017-09-27 MED ORDER — ASPIRIN EC 81 MG PO TBEC
81.0000 mg | DELAYED_RELEASE_TABLET | Freq: Every day | ORAL | Status: DC
  Administered 2017-09-28: 81 mg via ORAL
  Filled 2017-09-27: qty 1

## 2017-09-27 MED ORDER — ONDANSETRON HCL 4 MG PO TABS
4.0000 mg | ORAL_TABLET | Freq: Four times a day (QID) | ORAL | Status: DC | PRN
Start: 2017-09-27 — End: 2017-09-28

## 2017-09-27 MED ORDER — METOPROLOL TARTRATE 50 MG PO TABS
50.0000 mg | ORAL_TABLET | Freq: Two times a day (BID) | ORAL | Status: DC
  Administered 2017-09-27 – 2017-09-28 (×2): 50 mg via ORAL
  Filled 2017-09-27 (×2): qty 1

## 2017-09-27 MED ORDER — ACETAMINOPHEN 325 MG PO TABS: 650.0000 mg | ORAL_TABLET | Freq: Four times a day (QID) | ORAL | Status: DC | PRN

## 2017-09-27 MED ORDER — OXYCODONE-ACETAMINOPHEN 5-325 MG PO TABS
1.0000 | ORAL_TABLET | ORAL | Status: DC | PRN
  Administered 2017-09-27 – 2017-09-28 (×4): 1 via ORAL
  Filled 2017-09-27 (×4): qty 1

## 2017-09-27 MED ORDER — OXYCODONE-ACETAMINOPHEN 5-325 MG PO TABS
1.0000 | ORAL_TABLET | Freq: Once | ORAL | Status: AC
  Administered 2017-09-27: 1 via ORAL
  Filled 2017-09-27: qty 1

## 2017-09-27 MED ORDER — ATORVASTATIN CALCIUM 40 MG PO TABS: 40.0000 mg | ORAL_TABLET | Freq: Every day | ORAL | Status: DC

## 2017-09-27 MED ORDER — ONDANSETRON HCL 4 MG/2ML IJ SOLN
4.0000 mg | Freq: Four times a day (QID) | INTRAMUSCULAR | Status: DC | PRN
  Administered 2017-09-28 (×2): 4 mg via INTRAVENOUS
  Filled 2017-09-27 (×2): qty 2

## 2017-09-27 MED ORDER — ACETAMINOPHEN 650 MG RE SUPP: 650.0000 mg | Freq: Four times a day (QID) | RECTAL | Status: DC | PRN

## 2017-09-27 MED ORDER — HYDROMORPHONE HCL 1 MG/ML IJ SOLN
1.0000 mg | INTRAMUSCULAR | Status: DC | PRN
  Administered 2017-09-27 – 2017-09-28 (×3): 1 mg via INTRAVENOUS
  Filled 2017-09-27 (×4): qty 1

## 2017-09-27 MED ORDER — HYDROMORPHONE HCL 1 MG/ML IJ SOLN
1.0000 mg | Freq: Once | INTRAMUSCULAR | Status: AC
  Administered 2017-09-27: 1 mg via INTRAVENOUS
  Filled 2017-09-27: qty 1

## 2017-09-27 MED ORDER — METFORMIN HCL ER 500 MG PO TB24: 500.0000 mg | ORAL_TABLET | Freq: Every day | ORAL | Status: DC

## 2017-09-27 MED ORDER — DICLOFENAC SODIUM 1 % TD GEL: 2.0000 g | Freq: Every day | TRANSDERMAL | Status: DC | PRN

## 2017-09-27 MED ORDER — FUROSEMIDE 20 MG PO TABS
20.0000 mg | ORAL_TABLET | Freq: Every day | ORAL | Status: DC
  Administered 2017-09-28: 20 mg via ORAL
  Filled 2017-09-27: qty 1

## 2017-09-27 MED ORDER — CLOBETASOL PROPIONATE 0.05 % EX CREA: 1.0000 "application " | TOPICAL_CREAM | Freq: Every day | CUTANEOUS | Status: DC | PRN

## 2017-09-27 MED ORDER — FLUTICASONE PROPIONATE 50 MCG/ACT NA SUSP: 1.0000 | Freq: Every day | NASAL | Status: DC

## 2017-09-27 MED ORDER — SODIUM CHLORIDE 0.9 % IV SOLN
INTRAVENOUS | Status: DC
  Administered 2017-09-27: 19:00:00 via INTRAVENOUS

## 2017-09-27 NOTE — ED Notes (Signed)
ED Provider at bedside. 

## 2017-09-27 NOTE — H&P (Signed)
History and Physical    Angela Craig ZOX:[id number] DOB: May 12, 1933 DOA: 09/27/2017  Referring MD/NP/PA: Dr. Crista Curb  PCP: Thayer Headings, MD    Patient coming from: home  Chief Complaint: pain in the ribs area  HPI: Angela Craig is a 83 y.o. female with medical history significant for hypertension who presented to ED s/p fall at home and falling onto her right side. Pain is 10/10 in intensity, worse with movement, sharp and radiating down to both sides from the mid rib section. Pain is constant. No alleviating factors other than pain meds given in ED. Patient reported her legs just gave out on her, she is usually weak on her ankles and has lot of gait instability. No associated loss of consciousness. No lightheadedness or dizziness. No chest pain, shortness of breath or palpitations. No GI or GU complaints.   ED Course: Pt was hemodynamically stable. X ray showed nondisplaced fractures involving the right posterolateral 8th and 9th ribs. Blood work is relatively unremarkable other than hgb of 10.9. She is admitted for pain management.     Review of Systems:  Constitutional: Negative for fever, chills, diaphoresis, activity change, appetite change and fatigue.  HENT: Negative for ear pain, nosebleeds, congestion, facial swelling, rhinorrhea, neck pain, neck stiffness and ear discharge.   Eyes: Negative for pain, discharge, redness, itching and visual disturbance.  Respiratory: Negative for cough, choking, chest tightness, shortness of breath, wheezing and stridor.   Cardiovascular: Negative for chest pain, palpitations and leg swelling.  Gastrointestinal: Negative for abdominal distention.  Genitourinary: Negative for dysuria, urgency, frequency, hematuria, flank pain, decreased urine volume, difficulty urinating and dyspareunia.  Musculoskeletal: Per HPI  Neurological: Negative for dizziness, tremors, seizures, syncope, facial asymmetry, speech difficulty, weakness,  light-headedness, numbness and headaches.  Hematological: Negative for adenopathy. Does not bruise/bleed easily.  Psychiatric/Behavioral: Negative for hallucinations, behavioral problems, confusion, dysphoric mood, decreased concentration and agitation.   Past Medical History:  Diagnosis Date  . CAD (coronary artery disease)    Non-critical by catheterization on April 27,2006 with normal LV function--last echocardiogram in 2003 which showed normal LVEF--last nuclear stress in August 2010 showed no significant ischemia with an EF of 70%  . Diabetes mellitus (HCC)   . Dizziness   . Hyperlipidemia   . Hypertension   . Obstructive sleep apnea    followed by Dr. Tresa Endo    Past Surgical History:  Procedure Laterality Date  . APPENDECTOMY    . CARDIAC CATHETERIZATION    . CHOLECYSTECTOMY    . FOOT SURGERY    . TUMOR REMOVAL      Social history:  reports that she has quit smoking. She uses smokeless tobacco. She reports that she does not drink alcohol or use drugs.  Ambulation: ambulates with walker and occasionally with assistance.  Allergies  Allergen Reactions  . Iodine Other (See Comments)    Unknown   . Novocain [Procaine] Other (See Comments)    Convulsions   . Neosporin [Neomycin-Bacitracin Zn-Polymyx] Rash    Family History  Problem Relation Age of Onset  . Diabetes Mother   . Heart attack Mother   . Heart disease Father   . Cancer Sister   . Cancer Brother   . Diabetes Brother   . Diabetes Sister     Prior to Admission medications   Medication Sig Start Date End Date Taking? Authorizing Provider  aspirin 81 MG tablet Take 81 mg by mouth daily.   Yes [provider]  benazepril (LOTENSIN) 40  MG tablet Take 40 mg by mouth daily.  09/11/15  Yes [provider]  Cholecalciferol (VITAMIN D3) 2000 UNITS TABS Take 2,000 Units by mouth daily.    Yes [provider]  clobetasol cream (TEMOVATE) 0.05 % Apply 1 application topically daily as needed  (eczema).  06/23/17  Yes [provider]  diclofenac sodium (VOLTAREN) 1 % GEL Apply 2 g topically daily as needed (pain).   Yes [provider]  esomeprazole (NEXIUM) 40 MG capsule Take 40 mg by mouth daily at 12 noon.   Yes [provider]  FUROSEMIDE PO Take 1 tablet by mouth daily. Strength unknown   Yes [provider]  Liraglutide (VICTOZA) 18 MG/3ML SOPN Inject 1.2 mg into the skin.    Yes [provider]  metFORMIN (GLUCOPHAGE-XR) 500 MG 24 hr tablet Take 500 mg by mouth every evening.  09/03/17  Yes [provider]  metoprolol (LOPRESSOR) 50 MG tablet Take 50 mg by mouth 2 (two) times daily.    Yes [provider]  Multiple Vitamin (MULTIVITAMIN) tablet Take 1 tablet by mouth daily.   Yes [provider]  oxyCODONE-acetaminophen (PERCOCET/ROXICET) 5-325 MG tablet Take 1 tablet by mouth every 6 (six) hours as needed for severe pain. 08/17/17  Yes Khatri, Hina, PA-C  simvastatin (ZOCOR) 80 MG tablet Take 40 mg by mouth every evening.    Yes [provider]  fluticasone (FLONASE) 50 MCG/ACT nasal spray Place into both nostrils daily.    [provider]    Physical Exam: Vitals:   09/27/17 1307 09/27/17 1309 09/27/17 1615 09/27/17 1615  BP:  (!) 154/64 (!) 175/57 (!) 175/57  Pulse:  62 60 63  Resp:  18  (!) 27  Temp:  98 F (36.7 C)  97.9 F (36.6 C)  TempSrc:  Oral  Oral  SpO2:  93% 93% 92%  Weight: 81.2 kg (179 lb)     Height: 5' 3.5" (1.613 m)       Constitutional: NAD, calm, comfortable Vitals:   09/27/17 1307 09/27/17 1309 09/27/17 1615 09/27/17 1615  BP:  (!) 154/64 (!) 175/57 (!) 175/57  Pulse:  62 60 63  Resp:  18  (!) 27  Temp:  98 F (36.7 C)  97.9 F (36.6 C)  TempSrc:  Oral  Oral  SpO2:  93% 93% 92%  Weight: 81.2 kg (179 lb)     Height: 5' 3.5" (1.613 m)      Eyes: PERRL, lids and conjunctivae normal ENMT: Mucous membranes are moist. Posterior pharynx clear of any exudate  or lesions.Normal dentition.  Neck: normal, supple, no masses, no thyromegaly Respiratory: clear to auscultation bilaterally, no wheezing, no crackles. Normal respiratory effort. No accessory muscle use.  Cardiovascular: Regular rate and rhythm, no murmurs / rubs / gallops. Edema in ankles from fall Abdomen: no tenderness, no masses palpated. No hepatosplenomegaly. Bowel sounds positive.  Musculoskeletal: lot of discomfort due to pain in the ribs area on both sides, also swollen ankles   Skin: no rashes, lesions, ulcers. No induration Neurologic: CN 2-12 grossly intact. Sensation intact, DTR normal. Strength 5/5 in all 4.  Psychiatric: Normal judgment and insight. Alert and oriented x 3. Normal mood.  )  Labs on Admission: I have personally reviewed following labs and imaging studies  CBC:  Recent Labs Lab 09/27/17 1453  WBC 7.5  NEUTROABS 5.6  HGB 10.9*  HCT 34.9*  MCV 89.3  PLT 246   Basic Metabolic Panel:  Recent Labs  Lab 09/27/17 1453  NA 138  K 3.6  CL 105  CO2 25  GLUCOSE 159*  BUN 19  CREATININE 0.90  CALCIUM 10.0   GFR: Estimated Creatinine Clearance: 49.1 mL/min (by C-G formula based on SCr of 0.9 mg/dL). Liver Function Tests: No results for input(s): AST, ALT, ALKPHOS, BILITOT, PROT, ALBUMIN in the last 168 hours. No results for input(s): LIPASE, AMYLASE in the last 168 hours. No results for input(s): AMMONIA in the last 168 hours. Coagulation Profile: No results for input(s): INR, PROTIME in the last 168 hours. Cardiac Enzymes: No results for input(s): CKTOTAL, CKMB, CKMBINDEX, TROPONINI in the last 168 hours. BNP (last 3 results) No results for input(s): PROBNP in the last 8760 hours. HbA1C: No results for input(s): HGBA1C in the last 72 hours. CBG: No results for input(s): GLUCAP in the last 168 hours. Lipid Profile: No results for input(s): CHOL, HDL, LDLCALC, TRIG, CHOLHDL, LDLDIRECT in the last 72 hours. Thyroid Function Tests: No results for  input(s): TSH, T4TOTAL, FREET4, T3FREE, THYROIDAB in the last 72 hours. Anemia Panel: No results for input(s): VITAMINB12, FOLATE, FERRITIN, TIBC, IRON, RETICCTPCT in the last 72 hours. Urine analysis:    Component Value Date/Time   COLORURINE YELLOW 09/27/2017 1611   APPEARANCEUR HAZY (A) 09/27/2017 1611   LABSPEC 1.013 09/27/2017 1611   PHURINE 5.0 09/27/2017 1611   GLUCOSEU NEGATIVE 09/27/2017 1611   HGBUR NEGATIVE 09/27/2017 1611   BILIRUBINUR NEGATIVE 09/27/2017 1611   KETONESUR NEGATIVE 09/27/2017 1611   PROTEINUR 100 (A) 09/27/2017 1611   UROBILINOGEN 0.2 07/15/2009 1530   NITRITE NEGATIVE 09/27/2017 1611   LEUKOCYTESUR TRACE (A) 09/27/2017 1611   Sepsis Labs: (procalcitonin:4,lacticidven:4) )No results found for this or any previous visit (from the past 240 hour(s)).   Radiological Exams on Admission: Dg Ribs Unilateral W/chest Right  Result Date: 09/27/2017 CLINICAL DATA:  Fall, right chest/ rib pain EXAM: RIGHT RIBS AND CHEST - 3+ VIEW COMPARISON:  CT chest dated 19 08/17/2017 FINDINGS: Nondisplaced fractures involving the right posterolateral 8th and 9th ribs. Lungs are clear.  No pleural effusion or pneumothorax. The heart is normal in size. IMPRESSION: Nondisplaced fractures involving the right posterolateral 8th and 9th ribs. Electronically Signed   By: Charline Bills M.D.   On: 09/27/2017 14:45   Dg Shoulder Right  Result Date: 09/27/2017 CLINICAL DATA:  Fall, right shoulder/rib pain EXAM: RIGHT SHOULDER - 2+ VIEW COMPARISON:  None. FINDINGS: No fracture or dislocation is seen. Mild degenerative changes the acromioclavicular joint. Visualized soft tissues are within normal limits. Visualized right lung is clear. IMPRESSION: No acute osseus abnormality is seen. Electronically Signed   By: Charline Bills M.D.   On: 09/27/2017 14:41    EKG: Pending  Assessment/Plan  Active Problems: Generalized weakness / S/P fall - X ray showed nondisplaced  fractures involving the right posterolateral 8th and 9th ribs - Right shoulder x ray showed no acute osseus abnormality is seen - Continue pain management efforts - PT eval once pain controlled   Essential hypertension - Continue benazepril and metoprolol  - Continue lasix   Dyslipidemia - Continue Zocor   DM without complications - Resume metformin      DVT prophylaxis: SCD's  Code Status: full code Family Communication: daughter at the bedside  Disposition Plan: admission to medical floor Consults called: PT  Admission status: observation     Manson Passey MD [facility] Pager [phone]  If 7PM-7AM, please contact night-coverage [email or web address] Password [email or web address]  09/27/2017, 6:22 PM

## 2017-09-27 NOTE — ED Provider Notes (Signed)
[facility]-EMERGENCY DEPT Provider Note   CSN: [id number] Arrival date & time: 09/27/17  1255     History   Chief Complaint Chief Complaint  Patient presents with  . Fall    HPI Angela Craig is a 83 y.o. female.  The history is provided by the patient.  Fall  This is a new problem. The current episode started 1 to 2 hours ago. The problem occurs rarely. The problem has been resolved. Associated symptoms include chest pain. Pertinent negatives include no abdominal pain, no headaches and no shortness of breath. The symptoms are aggravated by twisting. Nothing relieves the symptoms. She has tried nothing for the symptoms.   83 year old female who presents after mechanical fall. She has a history of diabetes, hypertension hyperlipidemia and CAD. She is not anticoagulated. Reports that she was walking to the restroom from her bedroom with a walker. She was trying to place her walker at the side of the door so she can walk and, when it tipped and fell towards her. She lost her balance and fell on her right side. She did not hit her head or have loss of consciousness. Complains of pain in the right shoulder and right-sided rib cage. She did receive fentanyl in route by EMS with mild relief in her symptoms. Pain is worse with palpation, movement, and deep inspiration. Prior to her fall she was in her usual state of health. Denies any fevers, chills, nausea or vomiting, diarrhea, abdominal pain, difficulty breathing, cough, neck pain or back pain.   Past Medical History:  Diagnosis Date  . CAD (coronary artery disease)    Non-critical by catheterization on April 27,2006 with normal LV function--last echocardiogram in 2003 which showed normal LVEF--last nuclear stress in August 2010 showed no significant ischemia with an EF of 70%  . Diabetes mellitus (HCC)   . Dizziness   . Hyperlipidemia   . Hypertension   . Obstructive sleep apnea    followed by Dr. Tresa Endo    Patient Active Problem List     Diagnosis Date Noted  . Pain 09/27/2017    Past Surgical History:  Procedure Laterality Date  . APPENDECTOMY    . CARDIAC CATHETERIZATION    . CHOLECYSTECTOMY    . FOOT SURGERY    . TUMOR REMOVAL      OB History    No data available       Home Medications    Prior to Admission medications   Medication Sig Start Date End Date Taking? Authorizing Provider  aspirin 81 MG tablet Take 81 mg by mouth daily.    [provider]  benazepril (LOTENSIN) 40 MG tablet Take 40 mg by mouth daily.  09/11/15   [provider]  Cholecalciferol (VITAMIN D3) 2000 UNITS TABS Take 2,000 Units by mouth daily.     [provider]  clobetasol cream (TEMOVATE) 0.05 % Apply 1 application topically daily as needed (eczema).  06/23/17   [provider]  esomeprazole (NEXIUM) 40 MG capsule Take 40 mg by mouth daily at 12 noon.    [provider]  fluticasone (FLONASE) 50 MCG/ACT nasal spray Place into both nostrils daily.    [provider]  Liraglutide (VICTOZA) 18 MG/3ML SOPN Inject 1.2 mg into the skin.     [provider]  metFORMIN (GLUCOPHAGE) 500 MG tablet Take 500 mg by mouth every evening.    [provider]  metoprolol (LOPRESSOR) 50 MG tablet Take 50 mg by mouth 2 (two) times  daily.     [provider]  Multiple Vitamin (MULTIVITAMIN) tablet Take 1 tablet by mouth daily.    [provider]  oxyCODONE-acetaminophen (PERCOCET/ROXICET) 5-325 MG tablet Take 1 tablet by mouth every 6 (six) hours as needed for severe pain. 08/17/17   Khatri, Hina, PA-C  simvastatin (ZOCOR) 80 MG tablet Take 40 mg by mouth daily.     [provider]    Family History Family History  Problem Relation Age of Onset  . Diabetes Mother   . Heart attack Mother   . Heart disease Father   . Cancer Sister   . Cancer Brother   . Diabetes Brother   . Diabetes Sister     Social History Social History  Substance Use Topics   . Smoking status: Former Games developer  . Smokeless tobacco: Current User     Comment: Quit 2014  . Alcohol use No     Allergies   Iodine; Novocain [procaine]; and Neosporin [neomycin-bacitracin zn-polymyx]   Review of Systems Review of Systems  Constitutional: Negative for fever.  Respiratory: Negative for shortness of breath.   Cardiovascular: Positive for chest pain.  Gastrointestinal: Negative for abdominal pain.  Neurological: Negative for headaches.  All other systems reviewed and are negative.    Physical Exam Updated Vital Signs BP (!) 175/57 (BP Location: Right Arm)   Pulse 63   Temp 97.9 F (36.6 C) (Oral)   Resp (!) 27   Ht 5' 3.5" (1.613 m)   Wt 81.2 kg (179 lb)   SpO2 92%   BMI 31.21 kg/m   Physical Exam Physical Exam  Nursing note and vitals reviewed. Constitutional: Well developed, well nourished, non-toxic, and in no acute distress Head: Normocephalic and atraumatic.  Mouth/Throat: Oropharynx is clear and moist.  Neck: Normal range of motion. Neck supple. No cervical spine tenderness.  Cardiovascular: Normal rate and regular rhythm.   Pulmonary/Chest: Effort normal and breath sounds normal. Tenderness to palpation over the right mid to lower chest wall and posteriorly Abdominal: Soft. There is no tenderness. There is no rebound and no guarding.  Musculoskeletal: Normal range of motion.  Neurological: Alert, no facial droop, fluent speech, moves all extremities symmetrically Skin: Skin is warm and dry.  Psychiatric: Cooperative   ED Treatments / Results  Labs (all labs ordered are listed, but only abnormal results are displayed) Labs Reviewed  BASIC METABOLIC PANEL - Abnormal; Notable for the following:       Result Value   Glucose, Bld 159 (*)    GFR calc non Af Amer 58 (*)    All other components within normal limits  URINALYSIS, ROUTINE W REFLEX MICROSCOPIC - Abnormal; Notable for the following:    APPearance HAZY (*)    Protein, ur 100 (*)     Leukocytes, UA TRACE (*)    Bacteria, UA RARE (*)    Squamous Epithelial / LPF 0-5 (*)    All other components within normal limits  URINE CULTURE  CBC WITH DIFFERENTIAL/PLATELET    EKG  EKG Interpretation None       Radiology Dg Ribs Unilateral W/chest Right  Result Date: 09/27/2017 CLINICAL DATA:  Fall, right chest/ rib pain EXAM: RIGHT RIBS AND CHEST - 3+ VIEW COMPARISON:  CT chest dated 19 08/17/2017 FINDINGS: Nondisplaced fractures involving the right posterolateral 8th and 9th ribs. Lungs are clear.  No pleural effusion or pneumothorax. The heart is normal in size. IMPRESSION: Nondisplaced fractures involving the right posterolateral 8th and 9th ribs. Electronically  Signed   By: Charline Bills M.D.   On: 09/27/2017 14:45   Dg Shoulder Right  Result Date: 09/27/2017 CLINICAL DATA:  Fall, right shoulder/rib pain EXAM: RIGHT SHOULDER - 2+ VIEW COMPARISON:  None. FINDINGS: No fracture or dislocation is seen. Mild degenerative changes the acromioclavicular joint. Visualized soft tissues are within normal limits. Visualized right lung is clear. IMPRESSION: No acute osseus abnormality is seen. Electronically Signed   By: Charline Bills M.D.   On: 09/27/2017 14:41    Procedures Procedures (including critical care time)  Medications Ordered in ED Medications  HYDROcodone-acetaminophen (NORCO/VICODIN) 5-325 MG per tablet 1 tablet (1 tablet Oral Given 09/27/17 1333)  oxyCODONE-acetaminophen (PERCOCET/ROXICET) 5-325 MG per tablet 1 tablet (1 tablet Oral Given 09/27/17 1617)     Initial Impression / Assessment and Plan / ED Course  I have reviewed the triage vital signs and the nursing notes.  Pertinent labs & imaging results that were available during my care of the patient were reviewed by me and considered in my medical decision making (see chart for details).     Presents after mechanical fall with right-sided rib pain. With evidence of eight and ninth right  posterior lateral rib fractures. No other injuries on CT head and neck or by history/exam. Pain has not been well controlled. Did receive fentanyl per EMS, hydrocodone and subsequently Percocet. Unable to get out of bed and ambulate. Discussed with Dr. Elisabeth Pigeon who will admit for pain control.  Final Clinical Impressions(s) / ED Diagnoses   Final diagnoses:  Closed fracture of multiple ribs of right side, initial encounter    New Prescriptions New Prescriptions   No medications on file     Lavera Guise, MD 09/27/17 [phone]

## 2017-09-27 NOTE — Care Management Note (Addendum)
Case Management Note  Patient Details  Name: Angela Craig MRN: [id number] Date of Birth: [birth year]  Subjective/Objective:    falls                Action/Plan: Discharge Planning: NCM spoke to pt's dtr, Bonita Quin. Dtr states pt is at home during the day alone. States they do work. States siblings do assist pt in evening and at night. Pt is declining SNF. Dtr states she had [facility] for [facility]. Contacted [facility] with referral. Spoke to pt's ED RN and pt's pain is a 10 on pain scale of 0-10. Will have PT evaluate in ED, pt may need SNF rehab. CSW referral for SNF. Will need PT/OT evaluation for recommendations.   PCP Thayer Headings MD  Expected Discharge Date:                  Expected Discharge Plan:  Home w Home Health Services  In-House Referral:  NA  Discharge planning Services  CM Consult  Post Acute Care Choice:  Home Health Choice offered to:  Adult Children  DME Arranged:  N/A DME Agency:  NA  HH Arranged:  PT, Nurse's Aide, Social Work [employer] Agency:  [facility]  Status of Service:  Completed, signed off  If discussed at [employer], dates discussed:    Additional Comments:  Elliot Cousin, RN 09/27/2017, 4:19 PM

## 2017-09-27 NOTE — ED Notes (Signed)
Bed: WU98 Expected date:  Expected time:  Means of arrival:  Comments: 83 yo fall; wrist injury

## 2017-09-27 NOTE — Progress Notes (Signed)
Pt. arrived to floor from ED via stretcher. Daughter Bonita Quin at bedside. Pt. alert and oriented x 4, no respiratory distress noted.

## 2017-09-27 NOTE — ED Triage Notes (Signed)
Heading to restroom at home, walker tipped and caused her to hit right side / back area. Pain on inspiration. Pain on top of right shoulder. of fentanyl was given 1237.

## 2017-09-27 NOTE — ED Notes (Signed)
Called pharmacy to confirm CBC was drawn. It was drawn at 1500-1530

## 2017-09-28 DIAGNOSIS — S2241XA Multiple fractures of ribs, right side, initial encounter for closed fracture: Secondary | ICD-10-CM

## 2017-09-28 LAB — CBC
HCT: 34.9 % — ABNORMAL LOW (ref 36.0–46.0)
Hemoglobin: 11.1 g/dL — ABNORMAL LOW (ref 12.0–15.0)
MCH: 28.5 pg (ref 26.0–34.0)
MCHC: 31.8 g/dL (ref 30.0–36.0)
MCV: 89.7 fL (ref 78.0–100.0)
Platelets: 233 10*3/uL (ref 150–400)
RBC: 3.89 MIL/uL (ref 3.87–5.11)
RDW: 14.6 % (ref 11.5–15.5)
WBC: 8.2 10*3/uL (ref 4.0–10.5)

## 2017-09-28 LAB — GLUCOSE, CAPILLARY
Glucose-Capillary: 116 mg/dL — ABNORMAL HIGH (ref 65–99)
Glucose-Capillary: 119 mg/dL — ABNORMAL HIGH (ref 65–99)
Glucose-Capillary: 146 mg/dL — ABNORMAL HIGH (ref 65–99)

## 2017-09-28 LAB — COMPREHENSIVE METABOLIC PANEL
ALT: 15 U/L (ref 14–54)
AST: 17 U/L (ref 15–41)
Albumin: 3.5 g/dL (ref 3.5–5.0)
Alkaline Phosphatase: 94 U/L (ref 38–126)
Anion gap: 9 (ref 5–15)
BUN: 22 mg/dL — ABNORMAL HIGH (ref 6–20)
CO2: 26 mmol/L (ref 22–32)
Calcium: 10.1 mg/dL (ref 8.9–10.3)
Chloride: 105 mmol/L (ref 101–111)
Creatinine, Ser: 1.21 mg/dL — ABNORMAL HIGH (ref 0.44–1.00)
GFR calc Af Amer: 47 mL/min — ABNORMAL LOW (ref >60)
GFR calc non Af Amer: 41 mL/min — ABNORMAL LOW (ref >60)
Glucose, Bld: 115 mg/dL — ABNORMAL HIGH (ref 65–99)
Potassium: 4.3 mmol/L (ref 3.5–5.1)
Sodium: 140 mmol/L (ref 135–145)
Total Bilirubin: 0.6 mg/dL (ref 0.3–1.2)
Total Protein: 6.5 g/dL (ref 6.5–8.1)

## 2017-09-28 LAB — URINE CULTURE: Culture: >=100000 — AB

## 2017-09-28 MED ORDER — KETOROLAC TROMETHAMINE 30 MG/ML IJ SOLN
30.0000 mg | Freq: Once | INTRAMUSCULAR | Status: AC
  Administered 2017-09-28: 30 mg via INTRAVENOUS
  Filled 2017-09-28: qty 1

## 2017-09-28 MED ORDER — INSULIN ASPART 100 UNIT/ML ~~LOC~~ SOLN: 0.0000 [IU] | Freq: Every day | SUBCUTANEOUS | Status: DC

## 2017-09-28 MED ORDER — INSULIN ASPART 100 UNIT/ML ~~LOC~~ SOLN: 0.0000 [IU] | Freq: Three times a day (TID) | SUBCUTANEOUS | Status: DC

## 2017-09-28 MED ORDER — DICLOFENAC SODIUM 1 % TD GEL
2.0000 g | Freq: Two times a day (BID) | TRANSDERMAL | Status: DC
  Filled 2017-09-28: qty 100

## 2017-09-28 MED ORDER — CEFPODOXIME PROXETIL 200 MG PO TABS
200.0000 mg | ORAL_TABLET | Freq: Two times a day (BID) | ORAL | Status: DC
Start: 2017-09-28 — End: 2017-09-28
  Administered 2017-09-28: 200 mg via ORAL
  Filled 2017-09-28: qty 1

## 2017-09-28 MED ORDER — IBUPROFEN 600 MG PO TABS: 600.0000 mg | ORAL_TABLET | Freq: Three times a day (TID) | ORAL | 0 refills | Status: DC | PRN

## 2017-09-28 MED ORDER — CEFPODOXIME PROXETIL 200 MG PO TABS: 200.0000 mg | ORAL_TABLET | Freq: Two times a day (BID) | ORAL | 0 refills | Status: DC

## 2017-09-28 NOTE — Discharge Summary (Addendum)
Angela Craig WUJ:[id number] DOB: July 09, 1933 DOA: 09/27/2017  PCP: Thayer Headings, MD  Admit date: 09/27/2017  Discharge date: 09/28/2017  Admitted From: Home   Disposition:  SNF   Recommendations for Outpatient Follow-up:   Follow up with PCP in 1-2 weeks  PCP Please obtain BMP/CBC, 2 view CXR in 1week,  (see Discharge instructions)   PCP Please follow up on the following pending results:    Home Health: None  Equipment/Devices: already has a walker and a bedside commode  Consultations: none Discharge Condition: Fair   CODE STATUS: Full   Diet Recommendation:  Carb Modified  Heart Healthy    Chief Complaint  Patient presents with  . Fall     Brief history of present illness from the day of admission and additional interim summary    Angela Craig is a 83 y.o. female with medical history significant for hypertension who presented to ED s/p fall at home and falling onto her right side. Pain is 10/10 in intensity, worse with movement, sharp and radiating down to both sides from the mid rib section. Pain is constant. No alleviating factors other than pain meds given in ED. Patient reported her legs just gave out on her, she is usually weak on her ankles and has lot of gait instability. No associated loss of consciousness. No lightheadedness or dizziness. No chest pain, shortness of breath or palpitations. No GI or GU complaints. In the ER workup suggestive of right-sided eighth and ninth rib fracture she was kept in the hospital for pain control.                                                                 Hospital Course    1. Mechanical fall in the patient with poor gait and balance at baseline, who probably was rushing to the bathroom to urinate, previous similar falls. She was kept in the  hospital for right-sided eighth and ninth nondisplaced rib fractures and pain control, pain much improved after a single dose of Toradol, will be placed on scheduled Voltaren gel, when necessary ibuprofen, minimize narcotic use with ongoing falls, she already has a home walker and bedside commode which she has been encouraged to use rather than rushing to the bathroom and falling, home PT - aide and social worker could be arranged upon discharge from SNF, for now SNF for short term. SNF MD/PCP to monitor renal function closely. Note she had another falls few days ago with L.sided rib fractures.  2. Essential hypertension. Continue home medications.  3. Dyslipidemia. On Zocor  4. DM type II. On home regimen unchanged.   Discharge diagnosis     Active Problems:   Pain    Discharge instructions    Discharge Instructions    Diet - low sodium heart healthy  Complete by:  As directed    Discharge instructions    Complete by:  As directed    Follow with Primary MD Thayer Headings, MD in 7 days   Get CBC, CMP, 2 view Chest X ray checked  by Primary MD  in 5-7 days   Activity: As tolerated with Full fall precautions use walker/cane & assistance as needed  Disposition SNF  Diet:   Diet Carb Modified  Heart Healthy  , with feeding assistance and aspiration precautions.  For Heart failure patients - Check your Weight same time everyday, if you gain over 2 pounds, or you develop in leg swelling, experience more shortness of breath or chest pain, call your Primary MD immediately. Follow Cardiac Low Salt Diet and 1.5 lit/day fluid restriction.  On your next visit with your primary care physician please Get Medicines reviewed and adjusted.  Please request your Prim.MD to go over all Hospital Tests and Procedure/Radiological results at the follow up, please get all Hospital records sent to your Prim MD by signing hospital release before you go home.  If you experience worsening of your  admission symptoms, develop shortness of breath, life threatening emergency, suicidal or homicidal thoughts you must seek medical attention immediately by calling 911 or calling your MD immediately  if symptoms less severe.  You Must read complete instructions/literature along with all the possible adverse reactions/side effects for all the Medicines you take and that have been prescribed to you. Take any new Medicines after you have completely understood and accpet all the possible adverse reactions/side effects.   Do not drive, operate heavy machinery, perform activities at heights, swimming or participation in water activities or provide baby sitting services if your were admitted for syncope or siezures until you have seen by Primary MD or a Neurologist and advised to do so again.  Do not drive when taking Pain medications.    Do not take more than prescribed Pain, Sleep and Anxiety Medications  Special Instructions: If you have smoked or chewed Tobacco  in the last 2 yrs please stop smoking, stop any regular Alcohol  and or any Recreational drug use.  Wear Seat belts while driving.   Please note  You were cared for by a hospitalist during your hospital stay. If you have any questions about your discharge medications or the care you received while you were in the hospital after you are discharged, you can call the unit and asked to speak with the hospitalist on call if the hospitalist that took care of you is not available. Once you are discharged, your primary care physician will handle any further medical issues. Please note that NO REFILLS for any discharge medications will be authorized once you are discharged, as it is imperative that you return to your primary care physician (or establish a relationship with a primary care physician if you do not have one) for your aftercare needs so that they can reassess your need for medications and monitor your lab values.   Increase activity slowly     Complete by:  As directed       Discharge Medications   Allergies as of 09/28/2017      Reactions   Iodine Other (See Comments)   Unknown    Novocain [procaine] Other (See Comments)   Convulsions    Neosporin [neomycin-bacitracin Zn-polymyx] Rash      Medication List    STOP taking these medications   oxyCODONE-acetaminophen 5-325 MG tablet Commonly known as:  PERCOCET/ROXICET  TAKE these medications   aspirin 81 MG tablet Take 81 mg by mouth daily.   benazepril 40 MG tablet Commonly known as:  LOTENSIN Take 40 mg by mouth daily.   cefpodoxime 200 MG tablet Commonly known as:  VANTIN Take 1 tablet (200 mg total) by mouth every 12 (twelve) hours.   clobetasol cream 0.05 % Commonly known as:  TEMOVATE Apply 1 application topically daily as needed (eczema).   diclofenac sodium 1 % Gel Commonly known as:  VOLTAREN Apply 2 g topically daily as needed (pain).   esomeprazole 40 MG capsule Commonly known as:  NEXIUM Take 40 mg by mouth daily at 12 noon.   fluticasone 50 MCG/ACT nasal spray Commonly known as:  FLONASE Place into both nostrils daily.   FUROSEMIDE PO Take 1 tablet by mouth daily. Strength unknown   ibuprofen 600 MG tablet Commonly known as:  ADVIL,MOTRIN Take 1 tablet (600 mg total) by mouth every 8 (eight) hours as needed for moderate pain.   metFORMIN 500 MG 24 hr tablet Commonly known as:  GLUCOPHAGE-XR Take 500 mg by mouth every evening.   metoprolol tartrate 50 MG tablet Commonly known as:  LOPRESSOR Take 50 mg by mouth 2 (two) times daily.   multivitamin tablet Take 1 tablet by mouth daily.   simvastatin 80 MG tablet Commonly known as:  ZOCOR Take 40 mg by mouth every evening.   VICTOZA 18 MG/3ML Sopn Generic drug:  liraglutide Inject 1.2 mg into the skin.   Vitamin D3 2000 units Tabs Take 2,000 Units by mouth daily.        Contact information for follow-up providers    [facility], [facility] Follow up.     Specialty:  Home Health Services Why:  Home Health Physical Therapy, Social Worker, aide-will call you to arrange initial appointment Contact information: [address] [place] [address] [phone]        Thayer Headings, MD. Schedule an appointment as soon as possible for a visit in 5 day(s).   Specialty:  Internal Medicine Why:  call to arrange follow up appointment in one week Contact information: [address] [place] [address] [phone]            Contact information for after-discharge care    Destination    [facility] .   Specialty:  Skilled Nursing Facility Contact information: [address] [place] [address] [phone]                  Major procedures and Radiology Reports - PLEASE review detailed and final reports thoroughly  -         Dg Ribs Unilateral W/chest Right  Result Date: 09/27/2017 CLINICAL DATA:  Fall, right chest/ rib pain EXAM: RIGHT RIBS AND CHEST - 3+ VIEW COMPARISON:  CT chest dated 19 08/17/2017 FINDINGS: Nondisplaced fractures involving the right posterolateral 8th and 9th ribs. Lungs are clear.  No pleural effusion or pneumothorax. The heart is normal in size. IMPRESSION: Nondisplaced fractures involving the right posterolateral 8th and 9th ribs. Electronically Signed   By: Charline Bills M.D.   On: 09/27/2017 14:45   Dg Shoulder Right  Result Date: 09/27/2017 CLINICAL DATA:  Fall, right shoulder/rib pain EXAM: RIGHT SHOULDER - 2+ VIEW COMPARISON:  None. FINDINGS: No fracture or dislocation is seen. Mild degenerative changes the acromioclavicular joint. Visualized soft tissues are within normal limits. Visualized right lung is clear. IMPRESSION: No acute osseus abnormality  is seen. Electronically Signed   By: Charline Bills M.D.   On: 09/27/2017 14:41    Micro Results     No results found for this or any previous visit (from the  past 240 hour(s)).  Today   Subjective    Zaidee Rion today has no headache,no abdominal pain,no new weakness tingling or numbness, does have right-sided pleuritic chest pain at the site of rib injury but overall feels much better wants to go home today.     Objective   Blood pressure (!) 141/59, pulse 60, temperature 98 F (36.7 C), temperature source Oral, resp. rate 18, height  (1.6 m), weight 85.7 kg (189 lb), SpO2 90 %.   Intake/Output Summary (Last 24 hours) at 09/28/17 1434 Last data filed at 09/28/17 0533  Gross per 24 hour  Intake              100 ml  Output              200 ml  Net             -100 ml    Exam Awake Alert, Oriented x 3, No new F.N deficits, Normal affect [place].AT,PERRAL Supple Neck,No JVD, No cervical lymphadenopathy appriciated.  Symmetrical Chest wall movement, Good air movement bilaterally, CTAB RRR,No Gallops,Rubs or new Murmurs, No Parasternal Heave +ve B.Sounds, Abd Soft, Non tender, No organomegaly appriciated, No rebound -guarding or rigidity. No Cyanosis, Clubbing or edema, No new Rash or bruise   Data Review   CBC w Diff:  Lab Results  Component Value Date   WBC 8.2 09/28/2017   HGB 11.1 (L) 09/28/2017   HCT 34.9 (L) 09/28/2017   PLT 233 09/28/2017   LYMPHOPCT 17 09/27/2017   MONOPCT 6 09/27/2017   EOSPCT 1 09/27/2017   BASOPCT 0 09/27/2017    CMP:  Lab Results  Component Value Date   NA 140 09/28/2017   K 4.3 09/28/2017   CL 105 09/28/2017   CO2 26 09/28/2017   BUN 22 (H) 09/28/2017   CREATININE 1.21 (H) 09/28/2017   PROT 6.5 09/28/2017   ALBUMIN 3.5 09/28/2017   BILITOT 0.6 09/28/2017   ALKPHOS 94 09/28/2017   AST 17 09/28/2017   ALT 15 09/28/2017  .   Total Time in preparing paper work, data evaluation and todays exam - 35 minutes  Susa Raring M.D on 09/28/2017 at 2:34 PM  [facility]   Office  [phone]

## 2017-09-28 NOTE — Evaluation (Signed)
Physical Therapy Evaluation Patient Details Name: Angela Craig MRN: [id number] DOB: April 05, 1933 Today's Date: 09/28/2017   History of Present Illness  83 yo presented to ED after fall at home and R sided pain. xray reveals 9th and 10th R rib fractures . Pt and dtr states she has had recetn falls and with rib fractures on the left side . She also has become a little more increasingly weak and difficulty with LLE and progressing it during gait. Pt reports she "shuffles her feet at time and tells her L foot to progress forward and it will not or takes a long time".   Clinical Impression  Pt with fall and r rib fractures, very painful to move and limited with mobility. Due to limited ability to move and assist and guidance needed at this time , recommend ST-SNF prior to eventual goal to DC home. Pt very motivated and kind , however very limited with ability to move which would be very difficult at home. She is home alone , however if she had to DC home family would try to take off work and assist as they can. Will lcontinue to work with pt in acute care while she is here.     Follow Up Recommendations SNF (ST_SNF)    Equipment Recommendations  None recommended by PT    Recommendations for Other Services       Precautions / Restrictions Precautions Precautions: Fall Precaution Comments: educated pt and dtr with safety precautions for fall and initiation of gait for LLE  Restrictions Weight Bearing Restrictions: No      Mobility  Bed Mobility Overal bed mobility: Needs Assistance Bed Mobility: Sidelying to Sit;Sit to Sidelying   Sidelying to sit: Mod assist     Sit to sidelying: Mod assist General bed mobility comments: educated and reviewed importance of hooklying position with rolling to her side first before side to sit to avoid straina nd pull on R ribs. Discussed bed mobility a lot and how they could do this at home.   Transfers Overall transfer level: Needs  assistance Equipment used: Rolling walker (2 wheeled) Transfers: Sit to/from Stand Sit to Stand: Min assist         General transfer comment: from elevated bed and gradual rise with RW safety cues for hand placement   Ambulation/Gait Ambulation/Gait assistance: Min assist;+2 safety/equipment Ambulation Distance (Feet): 10 Feet Assistive device: Rolling walker (2 wheeled) Gait Pattern/deviations: Step-through pattern;Decreased step length - left;Shuffle;Decreased stance time - right Gait velocity: very slow and guarded (fearful of falling as well)    General Gait Details: difficulty getting LLE initiation of swig phase (has had this before but seems to progressively get worse per patient and dtr). fatigued really easily and pain in R ribs began to kick in  limiting distance as well.   Stairs            Wheelchair Mobility    Modified Rankin (Stroke Patients Only)       Balance Overall balance assessment: Needs assistance;History of Falls Sitting-balance support: Feet supported Sitting balance-Leahy Scale: Good     Standing balance support: Bilateral upper extremity supported;During functional activity Standing balance-Leahy Scale: Fair                               Pertinent Vitals/Pain Pain Assessment: 0-10 Pain Score: 5  Pain Location: right rib and right shoulder area (especially with any movement) holding piullow to this area  to help brace and also educated wtih mobility to minimize pain as much as possible  Pain Descriptors / Indicators: Shooting;Sharp Pain Intervention(s): Monitored during session;Limited activity within patient's tolerance    Home Living Family/patient expects to be discharged to:: Private residence Living Arrangements: Children Available Help at Discharge: Family   Home Access: Ramped entrance     Home Layout: One level Home Equipment: Environmental consultant - 2 wheels;Walker - 4 wheels;Bedside commode;Wheelchair - manual      Prior  Function Level of Independence: Independent with assistive device(s)         Comments: pt used RW in the home (had become increasingly more difficult and was active with HHPT at this time) , children assist however pt alone during the day.      Hand Dominance        Extremity/Trunk Assessment        Lower Extremity Assessment Lower Extremity Assessment: Overall WFL for tasks assessed (LLE with greater weakness than RLE (difficultyw ith initiating movement as well) )       Communication   Communication: No difficulties;HOH (soft spoke today due to pain in ribs)  Cognition Arousal/Alertness: Awake/alert Behavior During Therapy: WFL for tasks assessed/performed Overall Cognitive Status: Within Functional Limits for tasks assessed                                        General Comments      Exercises     Assessment/Plan    PT Assessment Patient needs continued PT services  PT Problem List Decreased strength;Decreased activity tolerance;Decreased mobility       PT Treatment Interventions Gait training;Functional mobility training;Therapeutic activities;Therapeutic exercise;Patient/family education    PT Goals (Current goals can be found in the Care Plan section)  Acute Rehab PT Goals Patient Stated Goal: I want to get better and be able to go home _ I am determined  PT Goal Formulation: With patient/family Time For Goal Achievement: 10/12/17 Potential to Achieve Goals: Good    Frequency Min 3X/week   Barriers to discharge        Co-evaluation               AM-PAC PT "6 Clicks" Daily Activity  Outcome Measure Difficulty turning over in bed (including adjusting bedclothes, sheets and blankets)?: Unable Difficulty moving from lying on back to sitting on the side of the bed? : Unable Difficulty sitting down on and standing up from a chair with arms (e.g., wheelchair, bedside commode, etc,.)?: Unable Help needed moving to and from a bed to  chair (including a wheelchair)?: A Lot Help needed walking in hospital room?: A Little Help needed climbing 3-5 steps with a railing? : Total 6 Click Score: 9    End of Session Equipment Utilized During Treatment: Gait belt (under arms (high) to avoid any pressure on rib area ) Activity Tolerance: Patient limited by fatigue;Patient limited by pain (tried very hard though, very motivated and kind) Patient left: in chair;with call bell/phone within reach;with chair alarm set;with family/visitor present Nurse Communication: Mobility status PT Visit Diagnosis: Unsteadiness on feet (R26.81);Repeated falls (R29.6);Muscle weakness (generalized) (M62.81);Difficulty in walking, not elsewhere classified (R26.2)    Time: 1610-9604 PT Time Calculation (min) (ACUTE ONLY): 49 min   Charges:   PT Evaluation $PT Eval Low Complexity: 1 Low PT Treatments $Gait Training: 8-22 mins $Therapeutic Activity: 8-22 mins   PT G Codes:  PT G-Codes **NOT FOR INPATIENT CLASS** Functional Assessment Tool Used: AM-PAC 6 Clicks Basic Mobility;Clinical judgement Functional Limitation: Mobility: Walking and moving around Mobility: Walking and Moving Around Current Status (R6045): At least 20 percent but less than 40 percent impaired, limited or restricted Mobility: Walking and Moving Around Goal Status [phone]): 0 percent impaired, limited or restricted    Marella Bile, PT Pager: [phone] 09/28/2017   Channie Bostick, Clois Dupes 09/28/2017, 12:44 PM

## 2017-09-28 NOTE — [place] FL2 (Signed)
[place] MEDICAID FL2 LEVEL OF CARE SCREENING TOOL     IDENTIFICATION  Patient Name: Angela Craig Birthdate: February 10, 1933 Sex: female Admission Date (Current Location): 09/27/2017  [facility] and [place] Number:  Producer, television/film/video and Address:  [facility], [address], [place], [place] [address]      Provider Number: [id number]  Attending Physician Name and Address:  Leroy Sea, MD  Relative Name and Phone Number:       Current Level of Care: Hospital Recommended Level of Care: Skilled Nursing Facility Prior Approval Number:    Date Approved/Denied:   PASRR Number: [id number]  Discharge Plan: SNF    Current Diagnoses: Patient Active Problem List   Diagnosis Date Noted  . Pain 09/27/2017    Orientation RESPIRATION BLADDER Height & Weight     Self, Time, Situation, Place  O2 (2L) Continent Weight: 189 lb (85.7 kg) Height:   (160 cm)  BEHAVIORAL SYMPTOMS/MOOD NEUROLOGICAL BOWEL NUTRITION STATUS      Continent Diet (Carb Modified  Heart Healthy )  AMBULATORY STATUS COMMUNICATION OF NEEDS Skin   Limited Assist Verbally Other (Comment) (V-shaped skin tear- foam dressing located on right arm)                       Personal Care Assistance Level of Assistance  Bathing, Dressing Bathing Assistance: Limited assistance   Dressing Assistance: Limited assistance     Functional Limitations Info             SPECIAL CARE FACTORS FREQUENCY  PT (By licensed PT), OT (By licensed OT)     PT Frequency: 5/wk OT Frequency: 5/wk            Contractures      Additional Factors Info  Allergies, Code Status, Insulin Sliding Scale Code Status Info: FULL Allergies Info:  Iodine, Novocain Procaine, Neosporin Neomycin-bacitracin Zn-polymyx   Insulin Sliding Scale Info: 4/day       Current Medications (09/28/2017):  This is the current hospital active medication list Current Facility-Administered Medications   Medication Dose Route Frequency Provider Last Rate Last Dose  . 0.9 %  sodium chloride infusion   Intravenous Continuous Alison Murray, MD 50 mL/hr at 09/27/17 1837    . acetaminophen (TYLENOL) tablet 650 mg  650 mg Oral Q6H PRN Alison Murray, MD       Or  . acetaminophen (TYLENOL) suppository 650 mg  650 mg Rectal Q6H PRN Alison Murray, MD      . aspirin EC tablet 81 mg  81 mg Oral Daily Alison Murray, MD   81 mg at 09/28/17 1011  . atorvastatin (LIPITOR) tablet 40 mg  40 mg Oral q1800 Alison Murray, MD      . benazepril (LOTENSIN) tablet 40 mg  40 mg Oral Daily Alison Murray, MD   40 mg at 09/28/17 1005  . cefpodoxime (VANTIN) tablet 200 mg  200 mg Oral Q12H Leroy Sea, MD   200 mg at 09/28/17 1011  . cholecalciferol (VITAMIN D) tablet 2,000 Units  2,000 Units Oral Daily Alison Murray, MD   2,000 Units at 09/28/17 1011  . clobetasol cream (TEMOVATE) 0.05 % 1 application  1 application Topical Daily PRN Alison Murray, MD      . diclofenac sodium (VOLTAREN) 1 % transdermal gel 2 g  2 g Topical BID Leroy Sea, MD      .  furosemide (LASIX) tablet 20 mg  20 mg Oral Daily Alison Murray, MD   20 mg at 09/28/17 1012  . insulin aspart (novoLOG) injection 0-5 Units  0-5 Units Subcutaneous QHS Susa Raring K, MD      . insulin aspart (novoLOG) injection 0-9 Units  0-9 Units Subcutaneous TID WC Leroy Sea, MD      . metoprolol tartrate (LOPRESSOR) tablet 50 mg  50 mg Oral BID Alison Murray, MD   50 mg at 09/28/17 1012  . multivitamin with minerals tablet 1 tablet  1 tablet Oral Daily Alison Murray, MD   1 tablet at 09/28/17 1012  . ondansetron (ZOFRAN) tablet 4 mg  4 mg Oral Q6H PRN Alison Murray, MD       Or  . ondansetron [facility]) injection 4 mg  4 mg Intravenous Q6H PRN Alison Murray, MD   4 mg at 09/28/17 0815  . oxyCODONE-acetaminophen (PERCOCET/ROXICET) 5-325 MG per tablet 1 tablet  1 tablet Oral Q4H PRN Alison Murray, MD   1 tablet at 09/28/17 [phone]  .  pantoprazole (PROTONIX) EC tablet 40 mg  40 mg Oral Daily Alison Murray, MD   40 mg at 09/28/17 1013     Discharge Medications: Please see discharge summary for a list of discharge medications.  Relevant Imaging Results:  Relevant Lab Results:   Additional Information SS#: [id number]  Burna Sis, LCSW

## 2017-09-28 NOTE — Progress Notes (Addendum)
CSW received same day referral for DC to SNF- family and pt agreeable for transfer to SNF.  Patient will discharge to [facility] Anticipated discharge date: 10/14 Family notified: at bedside Transportation by family Report #: [phone]  CSW signing off.  Burna Sis MSW, LCSW [facility] #: [phone]

## 2017-09-28 NOTE — Clinical Social Work Placement (Signed)
   CLINICAL SOCIAL WORK PLACEMENT  NOTE  Date:  09/28/2017  Patient Details  Name: Angela Craig MRN: [id number] Date of Birth: 03-Mar-1933  Clinical Social Work is seeking post-discharge placement for this patient at the Skilled  Nursing Facility level of care (*CSW will initial, date and re-position this form in  chart as items are completed):  Yes   Patient/family provided with [facility] Clinical Social Work Department's list of facilities offering this level of care within the geographic area requested by the patient (or if unable, by the patient's family).  Yes   Patient/family informed of their freedom to choose among providers that offer the needed level of care, that participate in Medicare, Medicaid or managed care program needed by the patient, have an available bed and are willing to accept the patient.  Yes   Patient/family informed of [facility]'s ownership interest in [facility] and [facility], as well as of the fact that they are under no obligation to receive care at these facilities.  PASRR submitted to EDS on 09/28/17     PASRR number received on 09/28/17     Existing PASRR number confirmed on       FL2 transmitted to all facilities in geographic area requested by pt/family on 09/28/17     FL2 transmitted to all facilities within larger geographic area on       Patient informed that his/her managed care company has contracts with or will negotiate with certain facilities, including the following:        Yes   Patient/family informed of bed offers received.  Patient chooses bed at [facility] [place]     Physician recommends and patient chooses bed at      Patient to be transferred to [facility] [place] on 09/28/17.  Patient to be transferred to facility by ptar     Patient family notified on 09/28/17 of transfer.  Name of family member notified:        PHYSICIAN Please sign FL2     Additional Comment:     _______________________________________________ Burna Sis, LCSW 09/28/2017, 2:33 PM

## 2017-09-28 NOTE — Discharge Instructions (Signed)
Follow with Primary MD Thayer Headings, MD in 7 days   Get CBC, CMP, 2 view Chest X ray checked  by Primary MD  in 5-7 days   Activity: As tolerated with Full fall precautions use walker/cane & assistance as needed  Disposition SNF   Diet:   Diet Carb Modified  Heart Healthy  , with feeding assistance and aspiration precautions.  For Heart failure patients - Check your Weight same time everyday, if you gain over 2 pounds, or you develop in leg swelling, experience more shortness of breath or chest pain, call your Primary MD immediately. Follow Cardiac Low Salt Diet and 1.5 lit/day fluid restriction.  On your next visit with your primary care physician please Get Medicines reviewed and adjusted.  Please request your Prim.MD to go over all Hospital Tests and Procedure/Radiological results at the follow up, please get all Hospital records sent to your Prim MD by signing hospital release before you go home.  If you experience worsening of your admission symptoms, develop shortness of breath, life threatening emergency, suicidal or homicidal thoughts you must seek medical attention immediately by calling 911 or calling your MD immediately  if symptoms less severe.  You Must read complete instructions/literature along with all the possible adverse reactions/side effects for all the Medicines you take and that have been prescribed to you. Take any new Medicines after you have completely understood and accpet all the possible adverse reactions/side effects.   Do not drive, operate heavy machinery, perform activities at heights, swimming or participation in water activities or provide baby sitting services if your were admitted for syncope or siezures until you have seen by Primary MD or a Neurologist and advised to do so again.  Do not drive when taking Pain medications.    Do not take more than prescribed Pain, Sleep and Anxiety Medications  Special Instructions: If you have smoked or chewed  Tobacco  in the last 2 yrs please stop smoking, stop any regular Alcohol  and or any Recreational drug use.  Wear Seat belts while driving.   Please note  You were cared for by a hospitalist during your hospital stay. If you have any questions about your discharge medications or the care you received while you were in the hospital after you are discharged, you can call the unit and asked to speak with the hospitalist on call if the hospitalist that took care of you is not available. Once you are discharged, your primary care physician will handle any further medical issues. Please note that NO REFILLS for any discharge medications will be authorized once you are discharged, as it is imperative that you return to your primary care physician (or establish a relationship with a primary care physician if you do not have one) for your aftercare needs so that they can reassess your need for medications and monitor your lab values.

## 2017-10-28 ENCOUNTER — Ambulatory Visit
Admission: RE | Admit: 2017-10-28 | Discharge: 2017-10-28 | Disposition: A | Discharge status: Home / Self Care | Payer: Medicare Other | Source: Ambulatory Visit | Attending: Family Medicine | Admitting: Family Medicine

## 2017-10-28 ENCOUNTER — Other Ambulatory Visit: Payer: Self-pay | Admitting: Family Medicine

## 2017-10-28 DIAGNOSIS — Z09 Encounter for follow-up examination after completed treatment for conditions other than malignant neoplasm: Secondary | ICD-10-CM

## 2017-12-19 DIAGNOSIS — R4189 Other symptoms and signs involving cognitive functions and awareness: Secondary | ICD-10-CM | POA: Diagnosis not present

## 2017-12-19 DIAGNOSIS — I10 Essential (primary) hypertension: Secondary | ICD-10-CM | POA: Diagnosis not present

## 2017-12-19 DIAGNOSIS — G64 Other disorders of peripheral nervous system: Secondary | ICD-10-CM | POA: Diagnosis not present

## 2017-12-19 DIAGNOSIS — E119 Type 2 diabetes mellitus without complications: Secondary | ICD-10-CM | POA: Diagnosis not present

## 2018-01-08 DIAGNOSIS — H40013 Open angle with borderline findings, low risk, bilateral: Secondary | ICD-10-CM | POA: Diagnosis not present

## 2018-01-08 DIAGNOSIS — H04123 Dry eye syndrome of bilateral lacrimal glands: Secondary | ICD-10-CM | POA: Diagnosis not present

## 2018-01-08 DIAGNOSIS — E119 Type 2 diabetes mellitus without complications: Secondary | ICD-10-CM | POA: Diagnosis not present

## 2018-01-08 DIAGNOSIS — H353131 Nonexudative age-related macular degeneration, bilateral, early dry stage: Secondary | ICD-10-CM | POA: Diagnosis not present

## 2018-03-06 ENCOUNTER — Other Ambulatory Visit: Payer: Self-pay

## 2018-03-06 ENCOUNTER — Emergency Department (HOSPITAL_BASED_OUTPATIENT_CLINIC_OR_DEPARTMENT_OTHER)
Admission: EM | Admit: 2018-03-06 | Discharge: 2018-03-06 | Disposition: A | Discharge status: Home / Self Care | Payer: Medicare HMO | Attending: Emergency Medicine | Admitting: Emergency Medicine

## 2018-03-06 ENCOUNTER — Encounter (HOSPITAL_BASED_OUTPATIENT_CLINIC_OR_DEPARTMENT_OTHER): Payer: Self-pay | Admitting: *Deleted

## 2018-03-06 ENCOUNTER — Other Ambulatory Visit: Payer: Self-pay | Admitting: Family Medicine

## 2018-03-06 ENCOUNTER — Emergency Department (HOSPITAL_BASED_OUTPATIENT_CLINIC_OR_DEPARTMENT_OTHER): Payer: Medicare HMO

## 2018-03-06 DIAGNOSIS — Y939 Activity, unspecified: Secondary | ICD-10-CM | POA: Insufficient documentation

## 2018-03-06 DIAGNOSIS — S060X0A Concussion without loss of consciousness, initial encounter: Secondary | ICD-10-CM

## 2018-03-06 DIAGNOSIS — E119 Type 2 diabetes mellitus without complications: Secondary | ICD-10-CM | POA: Diagnosis not present

## 2018-03-06 DIAGNOSIS — Z79899 Other long term (current) drug therapy: Secondary | ICD-10-CM | POA: Diagnosis not present

## 2018-03-06 DIAGNOSIS — Y999 Unspecified external cause status: Secondary | ICD-10-CM | POA: Insufficient documentation

## 2018-03-06 DIAGNOSIS — E118 Type 2 diabetes mellitus with unspecified complications: Secondary | ICD-10-CM | POA: Diagnosis not present

## 2018-03-06 DIAGNOSIS — Z7982 Long term (current) use of aspirin: Secondary | ICD-10-CM | POA: Diagnosis not present

## 2018-03-06 DIAGNOSIS — M25562 Pain in left knee: Secondary | ICD-10-CM | POA: Insufficient documentation

## 2018-03-06 DIAGNOSIS — Z794 Long term (current) use of insulin: Secondary | ICD-10-CM | POA: Insufficient documentation

## 2018-03-06 DIAGNOSIS — G64 Other disorders of peripheral nervous system: Secondary | ICD-10-CM | POA: Diagnosis not present

## 2018-03-06 DIAGNOSIS — W0110XA Fall on same level from slipping, tripping and stumbling with subsequent striking against unspecified object, initial encounter: Secondary | ICD-10-CM | POA: Insufficient documentation

## 2018-03-06 DIAGNOSIS — R531 Weakness: Secondary | ICD-10-CM | POA: Diagnosis not present

## 2018-03-06 DIAGNOSIS — I10 Essential (primary) hypertension: Secondary | ICD-10-CM | POA: Diagnosis not present

## 2018-03-06 DIAGNOSIS — R4189 Other symptoms and signs involving cognitive functions and awareness: Secondary | ICD-10-CM | POA: Diagnosis not present

## 2018-03-06 DIAGNOSIS — Y929 Unspecified place or not applicable: Secondary | ICD-10-CM | POA: Diagnosis not present

## 2018-03-06 DIAGNOSIS — S0990XA Unspecified injury of head, initial encounter: Secondary | ICD-10-CM | POA: Diagnosis not present

## 2018-03-06 DIAGNOSIS — S199XXA Unspecified injury of neck, initial encounter: Secondary | ICD-10-CM | POA: Diagnosis not present

## 2018-03-06 DIAGNOSIS — R51 Headache: Secondary | ICD-10-CM | POA: Diagnosis not present

## 2018-03-06 DIAGNOSIS — F17290 Nicotine dependence, other tobacco product, uncomplicated: Secondary | ICD-10-CM | POA: Diagnosis not present

## 2018-03-06 DIAGNOSIS — R410 Disorientation, unspecified: Secondary | ICD-10-CM | POA: Diagnosis not present

## 2018-03-06 DIAGNOSIS — I2510 Atherosclerotic heart disease of native coronary artery without angina pectoris: Secondary | ICD-10-CM | POA: Diagnosis not present

## 2018-03-06 DIAGNOSIS — M542 Cervicalgia: Secondary | ICD-10-CM | POA: Diagnosis not present

## 2018-03-06 DIAGNOSIS — R296 Repeated falls: Secondary | ICD-10-CM | POA: Diagnosis not present

## 2018-03-06 DIAGNOSIS — M8580 Other specified disorders of bone density and structure, unspecified site: Secondary | ICD-10-CM | POA: Diagnosis not present

## 2018-03-06 DIAGNOSIS — E1140 Type 2 diabetes mellitus with diabetic neuropathy, unspecified: Secondary | ICD-10-CM | POA: Diagnosis not present

## 2018-03-06 DIAGNOSIS — R69 Illness, unspecified: Secondary | ICD-10-CM | POA: Diagnosis not present

## 2018-03-06 MED ORDER — ACETAMINOPHEN 500 MG PO TABS
1000.0000 mg | ORAL_TABLET | Freq: Once | ORAL | Status: AC
  Administered 2018-03-06: 1000 mg via ORAL
  Filled 2018-03-06: qty 2

## 2018-03-06 NOTE — Discharge Instructions (Signed)
You have a incidental thyroid nodule found on CT.  This needs an ultrasound to follow this up.

## 2018-03-06 NOTE — ED Triage Notes (Signed)
She fell in her kitchen 5 days ago. She has had a headache since the fall. States she falls all the time. She used a walker.

## 2018-03-06 NOTE — ED Notes (Signed)
Patient transported to CT 

## 2018-03-06 NOTE — ED Provider Notes (Addendum)
[facility] EMERGENCY DEPARTMENT Provider Note   CSN: [id number] Arrival date & time: 03/06/18  1850     History   Chief Complaint Chief Complaint  Patient presents with  . Fall  . Head Injury    HPI Angela Craig is a 84 y.o. female.  84 yo F with a chief complaint of fall.  The patient has had frequent falls over the past few years.  She blames it on her left knee which ends up being painful and causes her to fall.  She has seen a neurologist for this and is thought to be neuropathy.  She had multiple episodes last week where she fell as well as about 5 days ago she fell and struck her head.  Since then she is had continuing headaches.  She denies nausea or vomiting.  She went to see her family physician who told her she needed a head CT and a CT of her neck.  She has been to 3 other medical centers today trying to get a stat CT.  The history is provided by the patient.  Fall  This is a new problem. The current episode started less than 1 hour ago. The problem occurs constantly. The problem has not changed since onset.Associated symptoms include headaches. Pertinent negatives include no chest pain and no shortness of breath. Nothing aggravates the symptoms. Nothing relieves the symptoms. She has tried nothing for the symptoms. The treatment provided no relief.  Head Injury   Pertinent negatives include no vomiting.    Past Medical History:  Diagnosis Date  . CAD (coronary artery disease)    Non-critical by catheterization on April 27,2006 with normal LV function--last echocardiogram in 2003 which showed normal LVEF--last nuclear stress in August 2010 showed no significant ischemia with an EF of 70%  . Diabetes mellitus (HCC)   . Dizziness   . Hyperlipidemia   . Hypertension   . Obstructive sleep apnea    followed by Dr. Tresa EndoKelly    Patient Active Problem List   Diagnosis Date Noted  . Pain 09/27/2017    Past Surgical History:  Procedure Laterality Date  .  APPENDECTOMY    . CARDIAC CATHETERIZATION    . CHOLECYSTECTOMY    . FOOT SURGERY    . TUMOR REMOVAL       OB History   None      Home Medications    Prior to Admission medications   Medication Sig Start Date End Date Taking? Authorizing Provider  aspirin 81 MG tablet Take 81 mg by mouth daily.   Yes [provider]  benazepril (LOTENSIN) 40 MG tablet Take 40 mg by mouth daily.  09/11/15  Yes [provider]  cefpodoxime (VANTIN) 200 MG tablet Take 1 tablet (200 mg total) by mouth every 12 (twelve) hours. 09/28/17  Yes Leroy SeaSingh, Prashant K, MD  Cholecalciferol (VITAMIN D3) 2000 UNITS TABS Take 2,000 Units by mouth daily.    Yes [provider]  esomeprazole (NEXIUM) 40 MG capsule Take 40 mg by mouth daily at 12 noon.   Yes [provider]  fluticasone (FLONASE) 50 MCG/ACT nasal spray Place into both nostrils daily.   Yes [provider]  FUROSEMIDE PO Take 1 tablet by mouth daily. Strength unknown   Yes [provider]  Liraglutide (VICTOZA) 18 MG/3ML SOPN Inject 1.2 mg into the skin.    Yes [provider]  metFORMIN (GLUCOPHAGE-XR) 500 MG 24 hr tablet Take 500 mg by mouth every evening.  09/03/17  Yes [provider]  metoprolol (LOPRESSOR) 50 MG tablet Take 50 mg by mouth 2 (two) times daily.    Yes [provider]  Multiple Vitamin (MULTIVITAMIN) tablet Take 1 tablet by mouth daily.   Yes [provider]  simvastatin (ZOCOR) 80 MG tablet Take 40 mg by mouth every evening.    Yes [provider]  clobetasol cream (TEMOVATE) 0.05 % Apply 1 application topically daily as needed (eczema).  06/23/17   [provider]  diclofenac sodium (VOLTAREN) 1 % GEL Apply 2 g topically daily as needed (pain).    [provider]  ibuprofen (ADVIL,MOTRIN) 600 MG tablet Take 1 tablet (600 mg total) by mouth every 8 (eight) hours as needed for moderate pain. 09/28/17   Leroy Sea, MD     Family History Family History  Problem Relation Age of Onset  . Diabetes Mother   . Heart attack Mother   . Heart disease Father   . Cancer Sister   . Cancer Brother   . Diabetes Brother   . Diabetes Sister     Social History Social History   Tobacco Use  . Smoking status: Former Games developer  . Smokeless tobacco: Current User  . Tobacco comment: Quit 2014  Substance Use Topics  . Alcohol use: No    Alcohol/week: 0.0 oz  . Drug use: No     Allergies   Iodine; Novocain [procaine]; and Neosporin [neomycin-bacitracin zn-polymyx]   Review of Systems Review of Systems  Constitutional: Negative for chills and fever.  HENT: Negative for congestion and rhinorrhea.   Eyes: Negative for redness and visual disturbance.  Respiratory: Negative for shortness of breath and wheezing.   Cardiovascular: Negative for chest pain and palpitations.  Gastrointestinal: Negative for nausea and vomiting.  Genitourinary: Negative for dysuria and urgency.  Musculoskeletal: Negative for arthralgias and myalgias.  Skin: Negative for pallor and wound.  Neurological: Positive for headaches. Negative for dizziness.     Physical Exam Updated Vital Signs BP (!) 189/60   Pulse 62   Temp 98 F (36.7 C) (Oral)   Resp 16   Ht 5' 3.5" (1.613 m)   Wt 77.1 kg (170 lb)   SpO2 97%   BMI 29.64 kg/m   Physical Exam  Constitutional: She is oriented to person, place, and time. She appears well-developed and well-nourished. No distress.  HENT:  Head: Normocephalic and atraumatic.  Eyes: Pupils are equal, round, and reactive to light. EOM are normal.  Neck: Normal range of motion. Neck supple.  Cardiovascular: Normal rate and regular rhythm. Exam reveals no gallop and no friction rub.  No murmur heard. Pulmonary/Chest: Effort normal. She has no wheezes. She has no rales.  Abdominal: Soft. She exhibits no distension and no mass. There is no tenderness. There is no guarding.  Musculoskeletal: She  exhibits no edema or tenderness.  Palpated from head to toe without any noted areas of bony tenderness.  Patient has an old appearing bruise to the right scapular area.  She also has a old appearing bruise to the right olecranon process.  She has full range of motion of the right upper extremity there is no pain along the bruised area.  Neurological: She is alert and oriented to person, place, and time.  Skin: Skin is warm and dry. She is not diaphoretic.  Psychiatric: She has a normal mood and affect. Her behavior is normal.  Nursing note and vitals reviewed.    ED Treatments / Results  Labs (all labs ordered are listed, but only abnormal results are displayed) Labs Reviewed - No data to display  EKG None  Radiology Ct Head Wo Contrast  Result Date: 03/06/2018 CLINICAL DATA:  Larey Seat 5 days ago, RIGHT shoulder and neck pain per family, coronary disease, hypertension, diabetes mellitus, foggy since falling per family EXAM: CT HEAD WITHOUT CONTRAST CT CERVICAL SPINE WITHOUT CONTRAST TECHNIQUE: Multidetector CT imaging of the head and cervical spine was performed following the standard protocol without intravenous contrast. Multiplanar CT image reconstructions of the cervical spine were also generated. COMPARISON:  CT head 08/17/2017, cervical spine radiographs 01/04/2005 FINDINGS: CT HEAD FINDINGS Brain: Generalized atrophy. Normal ventricular morphology. No midline shift or mass effect. Small vessel chronic ischemic changes of deep cerebral white matter. No intracranial hemorrhage, mass lesion, evidence of acute infarction, or extra-axial fluid collection. Vascular: No hyperdense vessels. Atherosclerotic calcifications within internal carotid arteries at skull base Skull: Intact Sinuses/Orbits: Clear Other: N/A CT CERVICAL SPINE FINDINGS Alignment: Normal Skull base and vertebrae: Visualized skull base intact. Multilevel facet degenerative changes. Vertebral body heights maintained without fracture  or subluxation. Slight disc space narrowing at multiple levels in the cervical spine. Soft tissues and spinal canal: Prevertebral soft tissues normal thickness. Scattered atherosclerotic calcifications. Small thyroid nodules largest LEFT 16 mm diameter. Disc levels:  No additional abnormalities Upper chest: Lung apices clear Other: N/A IMPRESSION: Atrophy with small vessel chronic ischemic changes of deep cerebral white matter. No acute intracranial abnormalities. Multilevel degenerative disc and facet disease changes of the cervical spine. No acute cervical spine abnormalities. Carotid atherosclerotic calcifications. Thyroid nodules up to 16 mm LEFT; follow-up non emergent thyroid ultrasound recommended. Electronically Signed   By: Ulyses Southward M.D.   On: 03/06/2018 19:51   Ct Cervical Spine Wo Contrast  Result Date: 03/06/2018 CLINICAL DATA:  Larey Seat 5 days ago, RIGHT shoulder and neck pain per family, coronary disease, hypertension, diabetes mellitus, foggy since falling per family EXAM: CT HEAD WITHOUT CONTRAST CT CERVICAL SPINE WITHOUT CONTRAST TECHNIQUE: Multidetector CT imaging of the head and cervical spine was performed following the standard protocol without intravenous contrast. Multiplanar CT image reconstructions of the cervical spine were also generated. COMPARISON:  CT head 08/17/2017, cervical spine radiographs 01/04/2005 FINDINGS: CT HEAD FINDINGS Brain: Generalized atrophy. Normal ventricular morphology. No midline shift or mass effect. Small vessel chronic ischemic changes of deep cerebral white matter. No intracranial hemorrhage, mass lesion, evidence of acute infarction, or extra-axial fluid collection. Vascular: No hyperdense vessels. Atherosclerotic calcifications within internal carotid arteries at skull base Skull: Intact Sinuses/Orbits: Clear Other: N/A CT CERVICAL SPINE FINDINGS Alignment: Normal Skull base and vertebrae: Visualized skull base intact. Multilevel facet degenerative changes.  Vertebral body heights maintained without fracture or subluxation. Slight disc space narrowing at multiple levels in the cervical spine. Soft tissues and spinal canal: Prevertebral soft tissues normal thickness. Scattered atherosclerotic calcifications. Small thyroid nodules largest LEFT 16 mm diameter. Disc levels:  No additional abnormalities Upper chest: Lung apices clear Other: N/A IMPRESSION: Atrophy with small vessel chronic ischemic changes of deep cerebral white matter. No acute intracranial abnormalities. Multilevel degenerative disc and facet disease changes of the cervical spine. No acute cervical spine abnormalities. Carotid atherosclerotic calcifications. Thyroid nodules up to 16 mm LEFT; follow-up non emergent thyroid ultrasound recommended. Electronically Signed   By: Ulyses Southward M.D.   On: 03/06/2018 19:51    Procedures Procedures (including critical care time)  Medications Ordered in ED Medications  acetaminophen (TYLENOL) tablet 1,000 mg (1,000 mg Oral Given  03/06/18 2001)     Initial Impression / Assessment and Plan / ED Course  I have reviewed the triage vital signs and the nursing notes.  Pertinent labs & imaging results that were available during my care of the patient were reviewed by me and considered in my medical decision making (see chart for details).     84 yo F with a chief complaint of a persistent headache after a fall from standing.  The patient has a history of frequent falls secondary to left knee pain.  The patient has some old bruises she has no midline spinal tenderness is able to rotate the head 45 degrees in either direction.  I doubt that the patient has a spinal fracture though with the primary physician recommending that one be obtained I will obtain a CT of the head and C-spine.  CT negative.  D/c home.   As I was discharging the patient the family brought up that she has had worsening of her instability at home.  The patient has been having  difficulty with ambulation for years and has been seen by the family physician and thought to be neuropathy.  She has been seeing a chiropractor and having nerve conduction testing as well as spinal alignment and nerve stimulator.  They are concerned that she has been mildly confused over the past couple days for a few seconds at a time.  They were already evaluated by the family physician earlier who had sent them here for imaging.  As the patient's symptoms do not seem to be acute I will have them follow-up as an outpatient.  I will give them an urgent consult to neurology as well as order home health and physical therapy.  8:24 PM:  I have discussed the diagnosis/risks/treatment options with the patient and family and believe the pt to be eligible for discharge home to follow-up with PCP. We also discussed returning to the ED immediately if new or worsening sx occur. We discussed the sx which are most concerning (e.g., sudden worsening pain, fever, inability to tolerate by mouth) that necessitate immediate return. Medications administered to the patient during their visit and any new prescriptions provided to the patient are listed below.  Medications given during this visit Medications  acetaminophen (TYLENOL) tablet 1,000 mg (1,000 mg Oral Given 03/06/18 2001)     The patient appears reasonably screen and/or stabilized for discharge and I doubt any other medical condition or other [facility] requiring further screening, evaluation, or treatment in the ED at this time prior to discharge.    Final Clinical Impressions(s) / ED Diagnoses   Final diagnoses:  Concussion without loss of consciousness, initial encounter    ED Discharge Orders    None       Melene Plan, DO 03/06/18 2024    Melene Plan, DO 03/06/18 2053

## 2018-03-12 DIAGNOSIS — R2681 Unsteadiness on feet: Secondary | ICD-10-CM | POA: Diagnosis not present

## 2018-03-12 DIAGNOSIS — E1142 Type 2 diabetes mellitus with diabetic polyneuropathy: Secondary | ICD-10-CM | POA: Diagnosis not present

## 2018-03-12 DIAGNOSIS — R296 Repeated falls: Secondary | ICD-10-CM | POA: Diagnosis not present

## 2018-04-22 ENCOUNTER — Other Ambulatory Visit: Payer: Self-pay | Admitting: Family Medicine

## 2018-04-22 DIAGNOSIS — S0990XA Unspecified injury of head, initial encounter: Secondary | ICD-10-CM

## 2018-04-22 DIAGNOSIS — R296 Repeated falls: Secondary | ICD-10-CM

## 2018-06-19 ENCOUNTER — Ambulatory Visit: Payer: Self-pay | Admitting: Neurology

## 2018-06-19 NOTE — Progress Notes (Deleted)
Angela Craig was seen today in the movement disorders clinic for neurologic consultation at the request of Hal Hope Marcos Eke, MD.  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  I have spoken to her referring physician on the phone about the patient and have reviewed medical records made available to me.  Pt has also seen Dr. Frances Furbish at [facility] in 2016 for these symptoms, but no diagnosis was given except mild neuropathy. The patient's first symptom was falls.  Falls started about 5 years ago.  However, they have increased recently.  She now falls about 1 time per week.  She was in the emergency room in September, October and March, all related to falls.    The first symptom(s) the patient noticed was {parkinsons general sx:18033} and this was {NUMBERS;0-15 BY 1:408015} {days/wks/mos/yrs:310907}.    Specific Symptoms:  Tremor: {yes no:314532} Family hx of similar:  {yes no:314532} Voice: *** Sleep: ***  Vivid Dreams:  {yes no:314532}  Acting out dreams:  {yes no:314532} Wet Pillows: {yes no:314532} Postural symptoms:  {yes no:314532}  Falls?  {yes no:314532} Bradykinesia symptoms: {parkinson brady:18041} Loss of smell:  {yes no:314532} Loss of taste:  {yes no:314532} Urinary Incontinence:  {yes no:314532} Difficulty Swallowing:  {yes no:314532} Handwriting, micrographia: {yes no:314532} Trouble with ADL's:  {yes no:314532}  Trouble buttoning clothing: {yes no:314532} Depression:  {yes no:314532} Memory changes:  {yes no:314532} Hallucinations:  {yes no:314532}  visual distortions: {yes no:314532} N/V:  {yes no:314532} Lightheaded:  {yes no:314532}  Syncope: {yes no:314532} Diplopia:  {yes no:314532} Dyskinesia:  {yes no:314532}  Neuroimaging of the brain has *** previously been performed.  It *** available for my review today.  MRI of the brain was [facility] at [facility] on March 25, 2018.  I do not have the films.  It was reported to show atrophy and small vessel disease.  MRI of the  cervical spine was performed on Apr 29, 2018.  Again, no films are made available to me.  There is no spinal cord abnormality.  There is some mild degenerative changes.  I did have the opportunity to review her head CT from March, 2019 and that was unremarkable.  MRI brain from 2016 was reviewed and demonstrated mod small vessel disease.  PREVIOUS MEDICATIONS: {Parkinson's RX:18200}  ALLERGIES:   Allergies  Allergen Reactions  . Iodine Other (See Comments)    Unknown   . Novocain [Procaine] Other (See Comments)    Convulsions   . Neosporin [Neomycin-Bacitracin Zn-Polymyx] Rash    CURRENT MEDICATIONS:  Outpatient Encounter Medications as of 06/19/2018  Medication Sig  . aspirin 81 MG tablet Take 81 mg by mouth daily.  . benazepril (LOTENSIN) 40 MG tablet Take 40 mg by mouth daily.   . cefpodoxime (VANTIN) 200 MG tablet Take 1 tablet (200 mg total) by mouth every 12 (twelve) hours.  . Cholecalciferol (VITAMIN D3) 2000 UNITS TABS Take 2,000 Units by mouth daily.   . clobetasol cream (TEMOVATE) 0.05 % Apply 1 application topically daily as needed (eczema).   . diclofenac sodium (VOLTAREN) 1 % GEL Apply 2 g topically daily as needed (pain).  Marland Kitchen esomeprazole (NEXIUM) 40 MG capsule Take 40 mg by mouth daily at 12 noon.  . fluticasone (FLONASE) 50 MCG/ACT nasal spray Place into both nostrils daily.  . FUROSEMIDE PO Take 1 tablet by mouth daily. Strength unknown  . ibuprofen (ADVIL,MOTRIN) 600 MG tablet Take 1 tablet (600 mg total) by mouth every 8 (eight) hours as needed for moderate pain.  Marland Kitchen  Liraglutide (VICTOZA) 18 MG/3ML SOPN Inject 1.2 mg into the skin.   . metFORMIN (GLUCOPHAGE-XR) 500 MG 24 hr tablet Take 500 mg by mouth every evening.   . metoprolol (LOPRESSOR) 50 MG tablet Take 50 mg by mouth 2 (two) times daily.   . Multiple Vitamin (MULTIVITAMIN) tablet Take 1 tablet by mouth daily.  . simvastatin (ZOCOR) 80 MG tablet Take 40 mg by mouth every evening.    No facility-administered  encounter medications on file as of 06/19/2018.     PAST MEDICAL HISTORY:   Past Medical History:  Diagnosis Date  . CAD (coronary artery disease)    Non-critical by catheterization on April 27,2006 with normal LV function--last echocardiogram in 2003 which showed normal LVEF--last nuclear stress in August 2010 showed no significant ischemia with an EF of 70%  . Diabetes mellitus (HCC)   . Dizziness   . Hyperlipidemia   . Hypertension   . Obstructive sleep apnea    followed by Dr. Tresa Endo    PAST SURGICAL HISTORY:   Past Surgical History:  Procedure Laterality Date  . APPENDECTOMY    . CARDIAC CATHETERIZATION    . CHOLECYSTECTOMY    . FOOT SURGERY    . TUMOR REMOVAL      SOCIAL HISTORY:   Social History   Socioeconomic History  . Marital status: Widowed    Spouse name: Not on file  . Number of children: 5  . Years of education: 37   . Highest education level: Not on file  Occupational History  . Occupation: Retired  Engineer, production  . Financial resource strain: Not on file  . Food insecurity:    Worry: Not on file    Inability: Not on file  . Transportation needs:    Medical: Not on file    Non-medical: Not on file  Tobacco Use  . Smoking status: Former Games developer  . Smokeless tobacco: Current User  . Tobacco comment: Quit 2014  Substance and Sexual Activity  . Alcohol use: No    Alcohol/week: 0.0 oz  . Drug use: No  . Sexual activity: Not on file  Lifestyle  . Physical activity:    Days per week: Not on file    Minutes per session: Not on file  . Stress: Not on file  Relationships  . Social connections:    Talks on phone: Not on file    Gets together: Not on file    Attends religious service: Not on file    Active member of club or organization: Not on file    Attends meetings of clubs or organizations: Not on file    Relationship status: Not on file  . Intimate partner violence:    Fear of current or ex partner: Not on file    Emotionally abused: Not on  file    Physically abused: Not on file    Forced sexual activity: Not on file  Other Topics Concern  . Not on file  Social History Narrative   Drinks about a pot of coffee a day, no tea or soda    FAMILY HISTORY:   Family Status  Relation Name Status  . Mother  Deceased at age 21  . Father  Deceased at age 38  . Sister  Deceased  . Brother  Deceased  . Brother  Alive  . Sister  Alive    ROS:  ROS  PHYSICAL EXAMINATION:    VITALS:  There were no vitals filed for this visit.  GEN:  The patient appears stated age and is in NAD. HEENT:  Normocephalic, atraumatic.  The mucous membranes are moist. The superficial temporal arteries are without ropiness or tenderness. CV:  RRR Lungs:  CTAB Neck/HEME:  There are no carotid bruits bilaterally.  Neurological examination:  Orientation: The patient is alert and oriented x3. Fund of knowledge is appropriate.  Recent and remote memory are intact.  Attention and concentration are normal.    Able to name objects and repeat phrases. Cranial nerves: There is good facial symmetry. Pupils are equal round and reactive to light bilaterally. Fundoscopic exam reveals clear margins bilaterally. Extraocular muscles are intact. The visual fields are full to confrontational testing. The speech is fluent and clear. Soft palate rises symmetrically and there is no tongue deviation. Hearing is intact to conversational tone. Sensation: Sensation is intact to light and pinprick throughout (facial, trunk, extremities). Vibration is intact at the bilateral big toe. There is no extinction with double simultaneous stimulation. There is no sensory dermatomal level identified. Motor: Strength is 5/5 in the bilateral upper and lower extremities.   Shoulder shrug is equal and symmetric.  There is no pronator drift. Deep tendon reflexes: Deep tendon reflexes are 2/4 at the bilateral biceps, triceps, brachioradialis, patella and achilles. Plantar responses are downgoing  bilaterally.  Movement examination: Tone: There is ***tone in the bilateral upper extremities.  The tone in the lower extremities is ***.  Abnormal movements: *** Coordination:  There is *** decremation with RAM's, *** Gait and Station: The patient has *** difficulty arising out of a deep-seated chair without the use of the hands. The patient's stride length is ***.  The patient has a *** pull test.      ASSESSMENT/PLAN:  ***  Cc:  Dois Davenportichter, Karen L, MD

## 2018-06-19 NOTE — Progress Notes (Signed)
Angela Craig was seen today in the movement disorders clinic for neurologic consultation at the request of Melburn Popper, *.  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  I have spoken to her referring physician on the phone about the patient and have reviewed medical records made available to me.  This patient is accompanied in the office by her daughter who supplements the history.Pt has also seen Dr. Rexene Alberts at [facility] in 2016 for these symptoms, but no diagnosis was given except mild neuropathy. The patient's first symptom was falls.  Falls started about 5 years ago.  However, they have increased recently.  She falls very frequently, about 3-4 times per week.  She was in the emergency room in September, October and March, all related to falls.  She always falls backward.  She last fell this morning.  She was in the kitchen and she tried to turn and then she was "on the floor."   Specific Symptoms:  Tremor: No. Family hx of similar:  No. Voice: weaker Sleep: sleeping well  Vivid Dreams:  No.  Acting out dreams:  No. Wet Pillows: No. Postural symptoms:  Yes.    Falls?  Yes.   Bradykinesia symptoms: difficulty getting out of a chair, difficulty regaining balance and drags the L leg and shortened stride length Loss of smell:  No. Loss of taste:  Yes.   Urinary Incontinence:  Yes.  , wears pad but doesn't always need it.  Pad is damp in the morning Difficulty Swallowing:  Yes.  , some trouble with large pills; some trouble dry foods Handwriting, micrographia: Yes.   Trouble with ADL's:  No.  Trouble buttoning clothing: No. Depression:  Occasionally from being in the house so much Memory changes:  Yes.  , lives with daughter, son, grandson.   Hallucinations:  No.  visual distortions: No. N/V:  No. Lightheaded:  Yes.    Syncope: No. Diplopia:  Yes.  , started about 6 months ago and had prism put in the lens and it really helped ([facility]).  Saw Dr. Hassell Done at [facility] yesterday for diplopia and those notes are reviewed.  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  Neuroimaging of the brain has previously been performed.  It is not available for my review today.  MRI of the brain was done at [facility] on March 25, 2018.  I do not have the films.  It was reported to show atrophy and small vessel disease.  MRI of the cervical spine was performed on Apr 29, 2018.  Again, no films are made available to me.  There is no spinal cord abnormality.  There is some mild degenerative changes.  I did have the opportunity to review her head CT from March, 2019 and that was unremarkable.  MRI brain from 2016 was reviewed and demonstrated mod small vessel disease.  PREVIOUS MEDICATIONS: none to date  ALLERGIES:   Allergies  Allergen Reactions  . Iodine Other (See Comments)    Unknown   . Novocain [Procaine] Other (See Comments)    Convulsions   . Neosporin [Neomycin-Bacitracin Zn-Polymyx] Rash    CURRENT MEDICATIONS:  Outpatient Encounter Medications as of 06/25/2018  Medication Sig  . aspirin 81 MG tablet Take 81 mg by mouth daily.  . benazepril (LOTENSIN) 40 MG tablet Take 40 mg by mouth daily.   . cefpodoxime (VANTIN) 200  MG tablet Take 1 tablet (200 mg total) by mouth every 12 (twelve) hours.  . Cholecalciferol (VITAMIN D3) 2000 UNITS TABS Take 2,000 Units by mouth daily.   . clobetasol cream (TEMOVATE) 0.73 % Apply 1 application topically daily as needed (eczema).   . diclofenac sodium (VOLTAREN) 1 % GEL Apply 2 g topically daily as needed (pain).  Marland Kitchen esomeprazole (NEXIUM) 40 MG capsule Take 40 mg by mouth daily at 12 noon.  . fluticasone (FLONASE) 50 MCG/ACT nasal spray Place into both nostrils daily.  . FUROSEMIDE PO Take 1 tablet by mouth 2 (two) times daily. Strength unknown   . ibuprofen (ADVIL,MOTRIN) 600 MG tablet Take 1 tablet (600 mg total) by  mouth every 8 (eight) hours as needed for moderate pain.  . Liraglutide (VICTOZA) 18 MG/3ML SOPN Inject 1.2 mg into the skin.   . metFORMIN (GLUCOPHAGE-XR) 500 MG 24 hr tablet Take 500 mg by mouth every evening.   . metoprolol (LOPRESSOR) 50 MG tablet Take 50 mg by mouth 2 (two) times daily.   . Multiple Vitamin (MULTIVITAMIN) tablet Take 1 tablet by mouth daily.  Marland Kitchen oxybutynin (DITROPAN-XL) 5 MG 24 hr tablet Take 5 mg by mouth daily.  . simvastatin (ZOCOR) 80 MG tablet Take 40 mg by mouth every evening.    No facility-administered encounter medications on file as of 06/25/2018.     PAST MEDICAL HISTORY:   Past Medical History:  Diagnosis Date  . CAD (coronary artery disease)    Non-critical by catheterization on April 27,2006 with normal LV function--last echocardiogram in 2003 which showed normal LVEF--last nuclear stress in August 2010 showed no significant ischemia with an EF of 70%  . Diabetes mellitus ([place])   . Dizziness   . Hyperlipidemia   . Hypertension   . Obstructive sleep apnea    followed by Dr. Claiborne Billings    PAST SURGICAL HISTORY:   Past Surgical History:  Procedure Laterality Date  . APPENDECTOMY    . breast tumor     benign  . CARDIAC CATHETERIZATION    . CATARACT EXTRACTION, BILATERAL    . CHOLECYSTECTOMY    . FOOT SURGERY    . TUBAL LIGATION      SOCIAL HISTORY:   Social History   Socioeconomic History  . Marital status: Widowed    Spouse name: Not on file  . Number of children: 5  . Years of education: 32   . Highest education level: Not on file  Occupational History  . Occupation: Retired  Scientific laboratory technician  . Financial resource strain: Not on file  . Food insecurity:    Worry: Not on file    Inability: Not on file  . Transportation needs:    Medical: Not on file    Non-medical: Not on file  Tobacco Use  . Smoking status: Current Every Day Smoker    Types: E-cigarettes  . Smokeless tobacco: Current User  . Tobacco comment: quit cigarettes in 2014;  now vape  Substance and Sexual Activity  . Alcohol use: No    Alcohol/week: 0.0 oz  . Drug use: No  . Sexual activity: Not on file  Lifestyle  . Physical activity:    Days per week: Not on file    Minutes per session: Not on file  . Stress: Not on file  Relationships  . Social connections:    Talks on phone: Not on file    Gets together: Not on file    Attends religious service: Not on file  Active member of club or organization: Not on file    Attends meetings of clubs or organizations: Not on file    Relationship status: Not on file  . Intimate partner violence:    Fear of current or ex partner: Not on file    Emotionally abused: Not on file    Physically abused: Not on file    Forced sexual activity: Not on file  Other Topics Concern  . Not on file  Social History Narrative   Drinks about a pot of coffee a day, no tea or soda    FAMILY HISTORY:   Family Status  Relation Name Status  . Mother  Deceased at age 37  . Father  Deceased at age 43  . Sister  Deceased  . Brother  Deceased  . Brother  Deceased  . Sister  Deceased  . Child  Deceased  . Child 4 Alive    ROS:  Review of Systems  Constitutional: Negative.   HENT: Negative.   Eyes: Positive for double vision.  Cardiovascular: Negative.   Gastrointestinal: Negative.   Genitourinary: Negative.   Musculoskeletal: Positive for falls.  Skin: Negative.   Neurological: Positive for weakness.  Endo/Heme/Allergies: Negative.     PHYSICAL EXAMINATION:    VITALS:   Vitals:   06/25/18 0947  BP: 140/64  Pulse: 60  SpO2: 97%    GEN:  The patient appears stated age and is in NAD. HEENT:  Normocephalic, atraumatic.  The mucous membranes are moist. The superficial temporal arteries are without ropiness or tenderness. CV:  RRR Lungs:  CTAB Neck/HEME:  There are no carotid bruits bilaterally.  Neurological examination:  Orientation: The patient is alert and oriented x3. Fund of knowledge is appropriate.   Recent and remote memory are intact.  Attention and concentration are normal.    Able to name objects and repeat phrases. Cranial nerves: There is good facial symmetry. Pupils are equal round and reactive to light bilaterally. Fundoscopic exam reveals clear margins bilaterally. Extraocular muscles are intact with the exception of left esotropia. The visual fields are full to confrontational testing. The speech is fluent and clear.  She has some pseudobulbar laughter.  She has some trouble with a guttural sounds.  Soft palate rises symmetrically and there is no tongue deviation. Hearing is intact to conversational tone. Sensation: Sensation is intact to light and pinprick throughout (facial, trunk, extremities). Vibration is intact at the bilateral big toe. There is no extinction with double simultaneous stimulation. There is no sensory dermatomal level identified. Motor: Strength is 5/5 in the bilateral upper and lower extremities.   Shoulder shrug is equal and symmetric.  There is no pronator drift. Deep tendon reflexes: Deep tendon reflexes are 2/4 at the bilateral biceps, triceps, brachioradialis, patella and achilles. Plantar responses is upgoing on the right and neutral on the left  Movement examination: Tone: There is mild increased tone in the LUE.   Abnormal movements: none Coordination:  There is significant decremation with RAM's, with any form of RAMS, including alternating supination and pronation of the forearm, hand opening and closing, finger taps, heel taps and toe taps, L much more than right Gait and Station: The patient makes multiple attempts to arise without the use of her hands and is unable to do so.  She actually has significant trouble arising with the use of her hands.   She drags the L leg and is very short stepped and unsteady.  She does much better with the rollator.  Labs: Patient had lab work completed.  Lab work was dated June 04, 2018.  Total cholesterol was 149.   Triglycerides were elevated at 190.  HDL was 45.  White blood cells were 7.8, hemoglobin 11.4, hematocrit 35.5.  Sodium was 139, potassium 4.4, chloride 103, CO2 26, BUN 25, creatinine 1.1, glucose 189, AST 19, ALT 20, alkaline phosphatase 131.  Hemoglobin A1c 7.5.  TSH was 3.06.  Acetylcholine receptor antibody 0.0.  SPEP was negative.  ASSESSMENT/PLAN:  1.  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  We talked about nature, etiology and pathophysiology. We talked about how the symptoms, course and prognosis differ from Parkinson's Disease.  We talked about the risks, particularly for falls and aspiration.   -It is my recommendation that given near daily falls that the patient stay seated at all times.  We discussed a merry walker, but ultimately I told her that I recommend she use a wheelchair.  They are getting evaluated for a motorized wheelchair.  -would recommend MBE at least for baseline and because already having trouble  -told her that many times carbidopa/levodopa is not helpful in PSP but we usually try.  Would recommend starting low-dose, but ultimately pushing it quite high, possibly up to 1000 mg daily just to see if any benefit is achieved (if the patient tolerates these type of dosages).  Would certainly not recommend starting with these types of dosages.  -She was given information to the local atypical parkinsonism support group.  She was given information on the care PSP organization.  They met with our Education officer, museum.  2.  Patient will be following back up with her primary neurologist.  This was just a second opinion for confirmation of diagnosis, which I agree with.  I am certainly happy to follow with her in the future if needed.  Much greater than 50% of this visit was spent in counseling and coordinating care.  Total face to face  time:  60 min   Cc:  Hayden Rasmussen, MD

## 2018-06-25 ENCOUNTER — Encounter: Payer: Self-pay | Admitting: Neurology

## 2018-06-25 ENCOUNTER — Ambulatory Visit: Payer: Medicare Other | Admitting: Neurology

## 2018-06-25 VITALS — BP 140/64 | HR 60

## 2018-06-25 DIAGNOSIS — G231 Progressive supranuclear ophthalmoplegia [Steele-Richardson-Olszewski]: Secondary | ICD-10-CM

## 2018-06-25 NOTE — Progress Notes (Signed)
Note sent

## 2018-07-12 ENCOUNTER — Emergency Department (HOSPITAL_COMMUNITY): Payer: Medicare Other

## 2018-07-12 ENCOUNTER — Encounter (HOSPITAL_COMMUNITY): Payer: Self-pay | Admitting: Emergency Medicine

## 2018-07-12 ENCOUNTER — Observation Stay (HOSPITAL_COMMUNITY)
Admission: EM | Admit: 2018-07-12 | Discharge: 2018-07-13 | Disposition: A | Discharge status: Home / Self Care | Payer: Medicare Other | Attending: Internal Medicine | Admitting: Internal Medicine

## 2018-07-12 ENCOUNTER — Observation Stay (HOSPITAL_COMMUNITY): Payer: Medicare Other

## 2018-07-12 ENCOUNTER — Other Ambulatory Visit: Payer: Self-pay

## 2018-07-12 DIAGNOSIS — Z7951 Long term (current) use of inhaled steroids: Secondary | ICD-10-CM | POA: Insufficient documentation

## 2018-07-12 DIAGNOSIS — E1140 Type 2 diabetes mellitus with diabetic neuropathy, unspecified: Secondary | ICD-10-CM

## 2018-07-12 DIAGNOSIS — S065X0A Traumatic subdural hemorrhage without loss of consciousness, initial encounter: Secondary | ICD-10-CM | POA: Diagnosis present

## 2018-07-12 DIAGNOSIS — M25531 Pain in right wrist: Secondary | ICD-10-CM | POA: Diagnosis not present

## 2018-07-12 DIAGNOSIS — M810 Age-related osteoporosis without current pathological fracture: Secondary | ICD-10-CM | POA: Diagnosis not present

## 2018-07-12 DIAGNOSIS — G4733 Obstructive sleep apnea (adult) (pediatric): Secondary | ICD-10-CM | POA: Insufficient documentation

## 2018-07-12 DIAGNOSIS — M25562 Pain in left knee: Secondary | ICD-10-CM

## 2018-07-12 DIAGNOSIS — I2510 Atherosclerotic heart disease of native coronary artery without angina pectoris: Secondary | ICD-10-CM | POA: Insufficient documentation

## 2018-07-12 DIAGNOSIS — M25572 Pain in left ankle and joints of left foot: Secondary | ICD-10-CM

## 2018-07-12 DIAGNOSIS — S065X9A Traumatic subdural hemorrhage with loss of consciousness of unspecified duration, initial encounter: Secondary | ICD-10-CM | POA: Diagnosis present

## 2018-07-12 DIAGNOSIS — S065XAA Traumatic subdural hemorrhage with loss of consciousness status unknown, initial encounter: Secondary | ICD-10-CM | POA: Diagnosis present

## 2018-07-12 DIAGNOSIS — Z79899 Other long term (current) drug therapy: Secondary | ICD-10-CM | POA: Diagnosis not present

## 2018-07-12 DIAGNOSIS — W01198A Fall on same level from slipping, tripping and stumbling with subsequent striking against other object, initial encounter: Secondary | ICD-10-CM | POA: Diagnosis not present

## 2018-07-12 DIAGNOSIS — W19XXXA Unspecified fall, initial encounter: Secondary | ICD-10-CM

## 2018-07-12 DIAGNOSIS — G20 Parkinson's disease: Secondary | ICD-10-CM | POA: Diagnosis not present

## 2018-07-12 DIAGNOSIS — Z66 Do not resuscitate: Secondary | ICD-10-CM | POA: Diagnosis not present

## 2018-07-12 DIAGNOSIS — G231 Progressive supranuclear ophthalmoplegia [Steele-Richardson-Olszewski]: Secondary | ICD-10-CM | POA: Diagnosis not present

## 2018-07-12 DIAGNOSIS — I10 Essential (primary) hypertension: Secondary | ICD-10-CM | POA: Diagnosis not present

## 2018-07-12 DIAGNOSIS — Z7982 Long term (current) use of aspirin: Secondary | ICD-10-CM | POA: Diagnosis not present

## 2018-07-12 DIAGNOSIS — E119 Type 2 diabetes mellitus without complications: Secondary | ICD-10-CM | POA: Diagnosis not present

## 2018-07-12 DIAGNOSIS — Z7984 Long term (current) use of oral hypoglycemic drugs: Secondary | ICD-10-CM | POA: Insufficient documentation

## 2018-07-12 DIAGNOSIS — R296 Repeated falls: Secondary | ICD-10-CM | POA: Diagnosis not present

## 2018-07-12 DIAGNOSIS — S61412A Laceration without foreign body of left hand, initial encounter: Secondary | ICD-10-CM

## 2018-07-12 DIAGNOSIS — F17290 Nicotine dependence, other tobacco product, uncomplicated: Secondary | ICD-10-CM | POA: Diagnosis not present

## 2018-07-12 DIAGNOSIS — S0083XA Contusion of other part of head, initial encounter: Secondary | ICD-10-CM

## 2018-07-12 DIAGNOSIS — Z9181 History of falling: Secondary | ICD-10-CM | POA: Insufficient documentation

## 2018-07-12 LAB — PROTIME-INR
INR: 0.94
Prothrombin Time: 12.5 seconds (ref 11.4–15.2)

## 2018-07-12 LAB — COMPREHENSIVE METABOLIC PANEL
ALT: 14 U/L (ref 0–44)
AST: 18 U/L (ref 15–41)
Albumin: 3.2 g/dL — ABNORMAL LOW (ref 3.5–5.0)
Alkaline Phosphatase: 97 U/L (ref 38–126)
Anion gap: 8 (ref 5–15)
BUN: 17 mg/dL (ref 8–23)
CO2: 24 mmol/L (ref 22–32)
Calcium: 10.3 mg/dL (ref 8.9–10.3)
Chloride: 108 mmol/L (ref 98–111)
Creatinine, Ser: 1.15 mg/dL — ABNORMAL HIGH (ref 0.44–1.00)
GFR calc Af Amer: 50 mL/min — ABNORMAL LOW (ref >60)
GFR calc non Af Amer: 43 mL/min — ABNORMAL LOW (ref >60)
Glucose, Bld: 158 mg/dL — ABNORMAL HIGH (ref 70–99)
Potassium: 3.6 mmol/L (ref 3.5–5.1)
Sodium: 140 mmol/L (ref 135–145)
Total Bilirubin: 0.5 mg/dL (ref 0.3–1.2)
Total Protein: 6 g/dL — ABNORMAL LOW (ref 6.5–8.1)

## 2018-07-12 LAB — CBC WITH DIFFERENTIAL/PLATELET
Abs Immature Granulocytes: 0 10*3/uL (ref 0.0–0.1)
Basophils Absolute: 0 10*3/uL (ref 0.0–0.1)
Basophils Relative: 1 %
Eosinophils Absolute: 0.1 10*3/uL (ref 0.0–0.7)
Eosinophils Relative: 1 %
HCT: 36.5 % (ref 36.0–46.0)
Hemoglobin: 11.2 g/dL — ABNORMAL LOW (ref 12.0–15.0)
Immature Granulocytes: 0 %
Lymphocytes Relative: 12 %
Lymphs Abs: 1 10*3/uL (ref 0.7–4.0)
MCH: 28 pg (ref 26.0–34.0)
MCHC: 30.7 g/dL (ref 30.0–36.0)
MCV: 91.3 fL (ref 78.0–100.0)
Monocytes Absolute: 0.5 10*3/uL (ref 0.1–1.0)
Monocytes Relative: 6 %
Neutro Abs: 6.8 10*3/uL (ref 1.7–7.7)
Neutrophils Relative %: 80 %
Platelets: 236 10*3/uL (ref 150–400)
RBC: 4 MIL/uL (ref 3.87–5.11)
RDW: 14.6 % (ref 11.5–15.5)
WBC: 8.5 10*3/uL (ref 4.0–10.5)

## 2018-07-12 LAB — CBG MONITORING, ED
Glucose-Capillary: 194 mg/dL — ABNORMAL HIGH (ref 70–99)
Glucose-Capillary: 116 mg/dL — ABNORMAL HIGH (ref 70–99)

## 2018-07-12 LAB — GLUCOSE, CAPILLARY
Glucose-Capillary: 111 mg/dL — ABNORMAL HIGH (ref 70–99)
Glucose-Capillary: 153 mg/dL — ABNORMAL HIGH (ref 70–99)

## 2018-07-12 LAB — MRSA PCR SCREENING: MRSA by PCR: NEGATIVE

## 2018-07-12 LAB — TSH: TSH: 2.463 u[IU]/mL (ref 0.350–4.500)

## 2018-07-12 MED ORDER — ACETAMINOPHEN 325 MG PO TABS
650.0000 mg | ORAL_TABLET | Freq: Four times a day (QID) | ORAL | Status: DC | PRN
  Administered 2018-07-12 – 2018-07-13 (×4): 650 mg via ORAL
  Filled 2018-07-12 (×4): qty 2

## 2018-07-12 MED ORDER — PANTOPRAZOLE SODIUM 40 MG PO TBEC
40.0000 mg | DELAYED_RELEASE_TABLET | Freq: Every day | ORAL | Status: DC
  Administered 2018-07-12 – 2018-07-13 (×2): 40 mg via ORAL
  Filled 2018-07-12 (×2): qty 1

## 2018-07-12 MED ORDER — BENAZEPRIL HCL 40 MG PO TABS
40.0000 mg | ORAL_TABLET | Freq: Every day | ORAL | Status: DC
  Administered 2018-07-12 – 2018-07-13 (×2): 40 mg via ORAL
  Filled 2018-07-12 (×2): qty 1
  Filled 2018-07-12: qty 2

## 2018-07-12 MED ORDER — ACETAMINOPHEN 650 MG RE SUPP: 650.0000 mg | Freq: Four times a day (QID) | RECTAL | Status: DC | PRN

## 2018-07-12 MED ORDER — INSULIN ASPART 100 UNIT/ML ~~LOC~~ SOLN: 0.0000 [IU] | Freq: Three times a day (TID) | SUBCUTANEOUS | Status: DC

## 2018-07-12 MED ORDER — FENTANYL CITRATE (PF) 100 MCG/2ML IJ SOLN
50.0000 ug | Freq: Once | INTRAMUSCULAR | Status: AC
  Administered 2018-07-12: 50 ug via INTRAVENOUS
  Filled 2018-07-12: qty 2

## 2018-07-12 MED ORDER — POLYETHYLENE GLYCOL 3350 17 G PO PACK: 17.0000 g | PACK | Freq: Every day | ORAL | Status: DC | PRN

## 2018-07-12 MED ORDER — FLUTICASONE PROPIONATE 50 MCG/ACT NA SUSP
1.0000 | Freq: Every day | NASAL | Status: DC
  Filled 2018-07-12: qty 16

## 2018-07-12 MED ORDER — ATORVASTATIN CALCIUM 40 MG PO TABS
40.0000 mg | ORAL_TABLET | Freq: Every day | ORAL | Status: DC
  Administered 2018-07-12: 40 mg via ORAL
  Filled 2018-07-12: qty 1

## 2018-07-12 MED ORDER — OXYBUTYNIN CHLORIDE ER 5 MG PO TB24
5.0000 mg | ORAL_TABLET | Freq: Every day | ORAL | Status: DC
  Administered 2018-07-12 – 2018-07-13 (×2): 5 mg via ORAL
  Filled 2018-07-12 (×2): qty 1

## 2018-07-12 NOTE — ED Notes (Signed)
Pt CBG 116 

## 2018-07-12 NOTE — H&P (Signed)
Date: 07/12/2018               Patient Name:  Angela Craig MRN: [id number]  DOB: 1933/02/02 Age / Sex: 84 y.o., female   PCP: Dois Davenport, MD         Medical Service: Internal Medicine Teaching Service         Attending Physician: Dr. Burns Spain, MD    First Contact: Dr. Chesley Mires Pager: [phone]  Second Contact: Dr. Samuella Cota Pager: [phone]       After Hours (After 5p/  First Contact Pager: [phone]  weekends / holidays): Second Contact Pager: [phone]   Chief Complaint: headache after mechanical fall  History of Present Illness: [name] is an 84 y/o female with PMHx DM, HTN, recurrent mechanical falls recently diagnosed with likely progressive supranuclear palsy who presents after a mechanical fall at home. She woke up in the middle of the night to go to the bathroom. She recalls feeling lightheaded when she got out of bed. She made it to the bathroom, but tripped and fell forward striking her face and forehead on the ground. She denied any LOC. She has complained of a headache since falling. Her daughter at bedside states she has been alert and oriented since the fall. Also complains of left ankle, left knee, and right wrist pain. She is not on any blood thinners, other than baby aspirin.  Patient has a significant history of mechanical falls that have been occurring almost daily. She lives at home with family, but they are concerned because there is not someone there 24/7. She does have a life-alert button. She has been evaluated by neurology and diagnosed with likely progressive supranuclear palsy (PSP). Per record review, neuro has recommended a wheelchair and possible Levadopa trial. Patient's daughter states they are in the process of getting her a motorized wheelchair, and they have PT come to work with her at the house twice a week.  Other ROS: Endorses non-productive cough for approximately 1 week. Denies fevers, chills, confusion, chest pain, shortness of  breath, palpitations, abdominal pain, n/v, urinary symptoms, changes in BMs.   ED course: Labs revealed normocytic anemia which is stable from 9 months ago (11.2), otherwise normal. L ankle x-ray without evidence of fracture; R wrist x-ray revealed no acute fracture with evidence of acute vs chronic ligamentous injury; left knee x-ray showed no acute bony abnormality; CT maxillofacial showed right subdural hematoma measuring 7mm without midline shift or skull fracture; left facial contusion and hematoma without facial bone fracture.  Neurosurgery was consulted and recommended observation and follow-up CT scan in 6-8 hours.   Meds:  Current Meds  Medication Sig  . aspirin 81 MG tablet Take 81 mg by mouth daily.  . benazepril (LOTENSIN) 40 MG tablet Take 40 mg by mouth daily.   . chlorthalidone (HYGROTON) 25 MG tablet Take 25 mg by mouth daily.  . Cholecalciferol (VITAMIN D3) 2000 UNITS TABS Take 2,000 Units by mouth daily.   . clobetasol cream (TEMOVATE) 0.05 % Apply 1 application topically daily as needed (eczema).   . diclofenac sodium (VOLTAREN) 1 % GEL Apply 2 g topically daily as needed (pain).  Marland Kitchen esomeprazole (NEXIUM) 40 MG capsule Take 40 mg by mouth daily.   . fluticasone (FLONASE) 50 MCG/ACT nasal spray Place 1 spray into both nostrils daily.   . furosemide (LASIX) 20 MG tablet Take 20 mg by mouth daily.  Marland Kitchen ibuprofen (ADVIL,MOTRIN) 600 MG tablet Take 1 tablet (600  mg total) by mouth every 8 (eight) hours as needed for moderate pain.  . Liraglutide (VICTOZA) 18 MG/3ML SOPN Inject 1.2 mg into the skin daily.   . metFORMIN (GLUCOPHAGE) 500 MG tablet Take 500 mg by mouth daily with breakfast.  . metoprolol (LOPRESSOR) 50 MG tablet Take 50 mg by mouth 2 (two) times daily.   . Multiple Vitamin (MULTIVITAMIN) tablet Take 1 tablet by mouth daily.  Marland Kitchen. oxybutynin (DITROPAN-XL) 5 MG 24 hr tablet Take 5 mg by mouth daily.  . simvastatin (ZOCOR) 80 MG tablet Take 40 mg by mouth every evening.       Allergies: Allergies as of 07/12/2018 - Review Complete 07/12/2018  Allergen Reaction Noted  . Iodine Other (See Comments) 06/27/2015  . Novocain [procaine] Other (See Comments) 06/27/2015  . Neosporin [neomycin-bacitracin zn-polymyx] Rash 08/17/2017   Past Medical History:  Diagnosis Date  . CAD (coronary artery disease)    Non-critical by catheterization on April 27,2006 with normal LV function--last echocardiogram in 2003 which showed normal LVEF--last nuclear stress in August 2010 showed no significant ischemia with an EF of 70%  . Diabetes mellitus (HCC)   . Dizziness   . Hyperlipidemia   . Hypertension   . Obstructive sleep apnea    followed by Dr. Tresa EndoKelly    Family History: heart disease, DM, brain cancer Social History: former tobacco use (quite in 2014); now vapes with e-cigarettes. No EtOH or illicit drug use  Review of Systems: A complete ROS was negative except as per HPI.   Physical Exam: Blood pressure (!) 152/53, pulse 62, temperature 97.9 F (36.6 C), temperature source Oral, resp. rate 19, SpO2 96 %. General: awake, alert pleasant female lying in bed in NAD Head/face: right forehead with scalp hematoma; large left maxillary facial hematoma Eyes: PEERLA; no raccoon eyes. EOM intact  Neck: supple; no goiter  Cardiovascular: RRR; no murmurs, gallops or rubs. Distal pulses intact bilaterally  Respiratory: no increased work of breathing; lungs CTA bilaterally  Abdominal: BS+. Abdomen is soft, non-tender, non-distended  MSK: Left knee with swelling and tenderness to palpation Neuro: A&Ox3. Cns II-XII intact. Strength is 4/5 in LLE likely 2/2 knee pain, 5/5 in the rest of her extremities. Decreased sensation in LLE; otherwise intact throughout.  Skin: multiple bruises on forearms from current and previous falls Psychiatric: appropriate mood and affect   Assessment & Plan by Problem: Active Problems:   Subdural hematoma (HCC) 1. Subdural hematoma: secondary  to mechanical fall in home. No LOC. Patient is alert and oriented without acute neurologic deficits. Neurosurgery on board and recommended observation and repeat CT given hematoma is small and patient is asymptomatic. Will order neuro checks q 2 and follow-up on repeat scan. She is on baby aspirin at home which we have held. Will order Tylenol PRN for pain; avoiding NSAIDs in setting of bleed.  No evidence of electrolyte abnormalities or arrhythmias that would indicate pre-syncope/syncopal event. Likely another mechanical fall in the setting of diagnosed PSP.    2. Frequent falls with recently diagnosed PSP: Patient reports falling nearly every day for several weeks. She is being followed by outpatient neurology; currently working on getting motorized wheelchair. They also recommended possible Levadopa trial, but have not started any medical therapy yet. We will order PT/OT eval while inpatient for further recommendations. She has another appointment with neurology this week (8/1). Daughter expresses concern that she does not have someone at home with her 24/7. Will have case manager follow for possible home health services  in addition to already established weekly home PT.   3. HTN: patient on benazepril 40 mg, Chlorthalidone 25 mg, and Lasix 20 mg at home. We will continue Benazepril, but are holding Lasix and Chlorthalidone. Patient reports light headedness prior to his fall. Will evaluate orthostatics and may need medication adjustments.   4. DM: on Victoza and Metformin 500 mg. Blood sugars stable in 100s. Have placed on sensitive sliding scale with CBG checks 4x daily.   Diet: heart healthy/carb modified DVT prophylaxis: SCDs Code: DNR   Dispo: Admit patient to Observation with expected length of stay less than 2 midnights.  SignedBridget Hartshorn, DO 07/12/2018, 12:27 PM  Pager: [phone]

## 2018-07-12 NOTE — ED Triage Notes (Signed)
Pt reports she got up to use the bathroom tonight, states she got dizzy and fell forward.  Pt has a hematoma to the the left eye, complaint of headache, laceration to right wrist, pain to left knee.  Blood pressure was 194/92 manual per EMS.

## 2018-07-12 NOTE — ED Notes (Signed)
Admitting MD at bedsdie

## 2018-07-12 NOTE — Care Management Note (Signed)
Case Management Note  Patient Details  Name: Angela Craig MRN: [id number] Date of Birth: 09/09/1933  Subjective/Objective:                 Spoke w patient's daughter who states that she is active w [facility]. Notified Alvino ChapelEllen w [facility], she is verifying, and return call w disciplines she is active with.  Ray,[name] Daughter [phone]      Action/Plan:   Expected Discharge Date:                  Expected Discharge Plan:  Home w Home Health Services  In-House Referral:     Discharge planning Services  CM Consult  Post Acute Care Choice:  Home Health Choice offered to:  Adult Children  DME Arranged:    DME Agency:     HH Arranged:  RN, Nurse's Aide, Social Work [facility]  [facility]  Status of Service:  In process, will continue to follow  If discussed at Long Length of Stay Meetings, dates discussed:    Additional Comments:  Lawerance SabalDebbie Ingeborg Fite, RN 07/12/2018, 11:11 AM

## 2018-07-12 NOTE — Consult Note (Signed)
Reason for Consult:subdural hematoma Referring Physician: emergency department  Angela Craig is an 84 y.o. female.  HPI: 84 year old female with a history of parkinsonism diabetes hypertension who had a fall yesterday afternoon was down for several hours with a positive loss consciousness of unknown. Time. Patient brought to ER earlier this morning CT scan showed a small skim right subdural hematoma the patient currently is complaining of headaches she did get some pain medication family reports she's been a little bit groggy since the pain medication but no other change. Reports she's not on any active blood thinners except for aspirin.  Past Medical History:  Diagnosis Date  . CAD (coronary artery disease)    Non-critical by catheterization on April 27,2006 with normal LV function--last echocardiogram in 2003 which showed normal LVEF--last nuclear stress in August 2010 showed no significant ischemia with an EF of 70%  . Diabetes mellitus ([place])   . Dizziness   . Hyperlipidemia   . Hypertension   . Obstructive sleep apnea    followed by Dr. Claiborne Billings    Past Surgical History:  Procedure Laterality Date  . APPENDECTOMY    . breast tumor     benign  . CARDIAC CATHETERIZATION    . CATARACT EXTRACTION, BILATERAL    . CHOLECYSTECTOMY    . FOOT SURGERY    . TUBAL LIGATION      Family History  Problem Relation Age of Onset  . Diabetes Mother   . Heart attack Mother   . Heart disease Father   . Breast cancer Sister   . Cancer Brother   . Diabetes Brother   . Diabetes Sister   . Suicidality Child     Social History:  reports that she has been smoking e-cigarettes.  She uses smokeless tobacco. She reports that she does not drink alcohol or use drugs.  Allergies:  Allergies  Allergen Reactions  . Iodine Other (See Comments)    Unknown   . Novocain [Procaine] Other (See Comments)    Convulsions   . Neosporin [Neomycin-Bacitracin Zn-Polymyx] Rash    Medications: I have  reviewed the patient's current medications.  Results for orders placed or performed during the hospital encounter of 07/12/18 (from the past 48 hour(s))  POC CBG, ED     Status: Abnormal   Collection Time: 07/12/18  3:12 AM  Result Value Ref Range   Glucose-Capillary 194 (H) 70 - 99 mg/dL  CBC with Differential     Status: Abnormal   Collection Time: 07/12/18  4:50 AM  Result Value Ref Range   WBC 8.5 4.0 - 10.5 K/uL   RBC 4.00 3.87 - 5.11 MIL/uL   Hemoglobin 11.2 (L) 12.0 - 15.0 g/dL   HCT 36.5 36.0 - 46.0 %   MCV 91.3 78.0 - 100.0 fL   MCH 28.0 26.0 - 34.0 pg   MCHC 30.7 30.0 - 36.0 g/dL   RDW 14.6 11.5 - 15.5 %   Platelets 236 150 - 400 K/uL   Neutrophils Relative % 80 %   Neutro Abs 6.8 1.7 - 7.7 K/uL   Lymphocytes Relative 12 %   Lymphs Abs 1.0 0.7 - 4.0 K/uL   Monocytes Relative 6 %   Monocytes Absolute 0.5 0.1 - 1.0 K/uL   Eosinophils Relative 1 %   Eosinophils Absolute 0.1 0.0 - 0.7 K/uL   Basophils Relative 1 %   Basophils Absolute 0.0 0.0 - 0.1 K/uL   Immature Granulocytes 0 %   Abs Immature Granulocytes 0.0 0.0 -  0.1 K/uL    Comment: Performed at [facility], [place] [address], [place], [place] [address]  Protime-INR     Status: None   Collection Time: 07/12/18  4:50 AM  Result Value Ref Range   Prothrombin Time 12.5 11.4 - 15.2 seconds   INR 0.94     Comment: Performed at [facility] [address] [place], [place] [address]  Comprehensive metabolic panel     Status: Abnormal   Collection Time: 07/12/18  4:50 AM  Result Value Ref Range   Sodium 140 135 - 145 mmol/L   Potassium 3.6 3.5 - 5.1 mmol/L   Chloride 108 98 - 111 mmol/L   CO2 24 22 - 32 mmol/L   Glucose, Bld 158 (H) 70 - 99 mg/dL   BUN 17 8 - 23 mg/dL   Creatinine, Ser 1.15 (H) 0.44 - 1.00 mg/dL   Calcium 10.3 8.9 - 10.3 mg/dL   Total Protein 6.0 (L) 6.5 - 8.1 g/dL   Albumin 3.2 (L) 3.5 - 5.0 g/dL   AST 18 15 - 41 U/L   ALT 14 0 - 44 U/L   Alkaline Phosphatase 97 38 - 126 U/L    Total Bilirubin 0.5 0.3 - 1.2 mg/dL   GFR calc non Af Amer 43 (L) >60 mL/min   GFR calc Af Amer 50 (L) >60 mL/min    Comment: (NOTE) The eGFR has been calculated using the CKD EPI equation. This calculation has not been validated in all clinical situations. eGFR's persistently <60 mL/min signify possible Chronic Kidney Disease.    Anion gap 8 5 - 15    Comment: Performed at [facility] [address] [place], [place] [address]  CBG monitoring, ED     Status: Abnormal   Collection Time: 07/12/18  7:48 AM  Result Value Ref Range   Glucose-Capillary 116 (H) 70 - 99 mg/dL   Comment 1 Notify RN    Comment 2 Document in Chart     Dg Wrist Complete Right  Result Date: 07/12/2018 CLINICAL DATA:  Fall with laceration to right wrist. EXAM: RIGHT WRIST - COMPLETE 3+ VIEW COMPARISON:  None. FINDINGS: There is no evidence of fracture. There is widening of the scapholunate interval at 5 mm. Calcification in the region of the triangular fibrocartilage. Soft tissues are unremarkable. IMPRESSION: 1. No acute fracture. 2. Widening of the scapholunate interval suggests ligamentous injury, this may be acute or chronic. Electronically Signed   By: Jeb Levering M.D.   On: 07/12/2018 05:19   Dg Ankle Complete Left  Result Date: 07/12/2018 CLINICAL DATA:  Fall with left ankle pain. EXAM: LEFT ANKLE COMPLETE - 3+ VIEW COMPARISON:  None. FINDINGS: Posterior aspect of the heel excluded on the lateral view. Overlying artifact from patient's sock also limits evaluation. Allowing for this, no evidence of acute fracture. The ankle mortise is preserved. There is an incidental os trigonum. No visualized joint effusion. IMPRESSION: No fracture or dislocation of the left ankle. Electronically Signed   By: Jeb Levering M.D.   On: 07/12/2018 05:20   Ct Head Wo Contrast  Result Date: 07/12/2018 CLINICAL DATA:  Dizziness getting up to use the bathroom tonight leading to fall. Hematoma about right eye.  Headache. EXAM: CT HEAD WITHOUT CONTRAST CT MAXILLOFACIAL WITHOUT CONTRAST TECHNIQUE: Multidetector CT imaging of the head and maxillofacial structures were performed using the standard protocol without intravenous contrast. Multiplanar CT image reconstructions of the maxillofacial structures were also generated. COMPARISON:  Head and cervical spine CT 03/06/2018 FINDINGS: CT HEAD FINDINGS Brain: Acute right subdural hematoma about the right frontotemporal lobe measures up to 7 mm in thickness. Minimal subdural blood along the falx superiorly. No significant midline shift. Unchanged general atrophy and chronic small vessel ischemia. No hydrocephalus, the basilar cisterns are patent. Vascular: Atherosclerosis of skullbase vasculature without hyperdense vessel or abnormal calcification. Skull: No fracture or focal lesion. Other: Large right frontal scalp hematoma. CT MAXILLOFACIAL FINDINGS Osseous: Nasal bone, zygomatic arches, and mandibles are intact. The temporomandibular joints are congruent. Patient is edentulous. Orbits: No orbital fracture. Both orbits and globes are intact. Prior bilateral cataract resection. Sinuses: Minimal mucosal thickening. No fluid level or sinus fracture. Soft tissues: Soft tissue edema and hematoma overlying the left cheek. IMPRESSION: 1. Small to moderate-sized acute right subdural hematoma about the right frontotemporal lobe measuring up to 7 mm. No significant midline shift. No skull fracture. 2. Left facial contusion and hematoma without facial bone fracture. Critical Value/emergent results were called by telephone at the time of interpretation on 07/12/2018 at 4:01 am to Dr. Addison Lank , who verbally acknowledged these results. Electronically Signed   By: Jeb Levering M.D.   On: 07/12/2018 04:02   Dg Knee Complete 4 Views Left  Result Date: 07/12/2018 CLINICAL DATA:  Fall EXAM: LEFT KNEE - COMPLETE 4+ VIEW COMPARISON:  None. FINDINGS: No acute displaced fracture or  malalignment. No significant knee effusion. Vascular calcifications. Chondrocalcinosis. IMPRESSION: 1. No acute osseous abnormality. 2. Chondrocalcinosis Electronically Signed   By: Donavan Foil M.D.   On: 07/12/2018 03:33   Ct Maxillofacial Wo Contrast  Result Date: 07/12/2018 CLINICAL DATA:  Dizziness getting up to use the bathroom tonight leading to fall. Hematoma about right eye. Headache. EXAM: CT HEAD WITHOUT CONTRAST CT MAXILLOFACIAL WITHOUT CONTRAST TECHNIQUE: Multidetector CT imaging of the head and maxillofacial structures were performed using the standard protocol without intravenous contrast. Multiplanar CT image reconstructions of the maxillofacial structures were also generated. COMPARISON:  Head and cervical spine CT 03/06/2018 FINDINGS: CT HEAD FINDINGS Brain: Acute right subdural hematoma about the right frontotemporal lobe measures up to 7 mm in thickness. Minimal subdural blood along the falx superiorly. No significant midline shift. Unchanged general atrophy and chronic small vessel ischemia. No hydrocephalus, the basilar cisterns are patent. Vascular: Atherosclerosis of skullbase vasculature without hyperdense vessel or abnormal calcification. Skull: No fracture or focal lesion. Other: Large right frontal scalp hematoma. CT MAXILLOFACIAL FINDINGS Osseous: Nasal bone, zygomatic arches, and mandibles are intact. The temporomandibular joints are congruent. Patient is edentulous. Orbits: No orbital fracture. Both orbits and globes are intact. Prior bilateral cataract resection. Sinuses: Minimal mucosal thickening. No fluid level or sinus fracture. Soft tissues: Soft tissue edema and hematoma overlying the left cheek. IMPRESSION: 1. Small to moderate-sized acute right subdural hematoma about the right frontotemporal lobe measuring up to 7 mm. No significant midline shift. No skull fracture. 2. Left facial contusion and hematoma without facial bone fracture. Critical Value/emergent results were  called by telephone at the time of interpretation on 07/12/2018 at 4:01 am to Dr. Addison Lank , who verbally acknowledged these results. Electronically Signed   By: Jeb Levering M.D.   On: 07/12/2018 04:02    Review of Systems  Neurological: Positive for headaches.   Blood pressure (!) 140/47, pulse (!) 57, temperature 97.9 F (36.6 C), temperature source Oral, resp. rate 17, SpO2 92 %. Physical Exam  Constitutional: She is oriented to person, place, and time. She appears well-developed.  HENT:  Head: Normocephalic.  Eyes: Pupils are equal, round, and reactive to light.  Neurological: She is alert and oriented to person, place, and time. She has normal strength. GCS eye subscore is 4. GCS verbal subscore is 5. GCS motor subscore is 6.  Patient is awake alert oriented 4 pupils are equal X was intact cranial nerves are intact strength is 5 out of 5 with no pronator drift    Assessment/Plan: 84 year old female status post fall with a small skim right-sided subdural no mass effect. Recommend observation and medical service management blood pressure diabetes and parkinsonism follow-up CT scan in 6-8 hours and if CT stable patient can be discharged home tomorrow if she's medically cleared.  Jianna Drabik P 07/12/2018, 8:11 AM

## 2018-07-12 NOTE — Progress Notes (Signed)
MD paged and order obtained for UA d/t confusion and fall prior to admission.

## 2018-07-12 NOTE — ED Provider Notes (Signed)
[facility] EMERGENCY DEPARTMENT Provider Note  CSN: [id number] Arrival date & time: 07/12/18 0246  Chief Complaint(s) Fall  HPI Angela Craig is a 84 y.o. female with a history of parkinsonian disease and gait instability presents to the emergency department after a mechanical fall causing her to fall forward striking her face and hitting her head.  She reports that she was walking to the bathroom and tripped over the door jam.  She did report that she was feeling dizzy prior to this.  patient denied any loss of consciousness.  She is endorsing left ankle, knee, right wrist, face, headache.  Extremity pain is exacerbated with palpation and movement.  No alleviating factors.  She denies any nausea vomiting.  She denies any chest pain or shortness of breath.  No abdominal pain.  No hip pain.  No neck or back pain.  Denies any other physical complaints.  HPI  Past Medical History Past Medical History:  Diagnosis Date  . CAD (coronary artery disease)    Non-critical by catheterization on April 27,2006 with normal LV function--last echocardiogram in 2003 which showed normal LVEF--last nuclear stress in August 2010 showed no significant ischemia with an EF of 70%  . Diabetes mellitus (HCC)   . Dizziness   . Hyperlipidemia   . Hypertension   . Obstructive sleep apnea    followed by Dr. Tresa Endo   Patient Active Problem List   Diagnosis Date Noted  . Subdural hematoma (HCC) 07/12/2018  . Pain 09/27/2017   Home Medication(s) Prior to Admission medications   Medication Sig Start Date End Date Taking? Authorizing Provider  aspirin 81 MG tablet Take 81 mg by mouth daily.   Yes [provider]  benazepril (LOTENSIN) 40 MG tablet Take 40 mg by mouth daily.  09/11/15  Yes [provider]  chlorthalidone (HYGROTON) 25 MG tablet Take 25 mg by mouth daily.   Yes [provider]  Cholecalciferol (VITAMIN D3) 2000 UNITS TABS Take 2,000 Units by mouth  daily.    Yes [provider]  clobetasol cream (TEMOVATE) 0.05 % Apply 1 application topically daily as needed (eczema).  06/23/17  Yes [provider]  diclofenac sodium (VOLTAREN) 1 % GEL Apply 2 g topically daily as needed (pain).   Yes [provider]  esomeprazole (NEXIUM) 40 MG capsule Take 40 mg by mouth daily.    Yes [provider]  fluticasone (FLONASE) 50 MCG/ACT nasal spray Place 1 spray into both nostrils daily.    Yes [provider]  furosemide (LASIX) 20 MG tablet Take 20 mg by mouth daily.   Yes [provider]  ibuprofen (ADVIL,MOTRIN) 600 MG tablet Take 1 tablet (600 mg total) by mouth every 8 (eight) hours as needed for moderate pain. 09/28/17  Yes Leroy Sea, MD  Liraglutide (VICTOZA) 18 MG/3ML SOPN Inject 1.2 mg into the skin daily.    Yes [provider]  metFORMIN (GLUCOPHAGE) 500 MG tablet Take 500 mg by mouth daily with breakfast.   Yes [provider]  metoprolol (LOPRESSOR) 50 MG tablet Take 50 mg by mouth 2 (two) times daily.    Yes [provider]  Multiple Vitamin (MULTIVITAMIN) tablet Take 1 tablet by mouth daily.   Yes [provider]  oxybutynin (DITROPAN-XL) 5 MG 24 hr tablet Take 5 mg by mouth daily. 06/08/18  Yes [provider]  simvastatin (ZOCOR) 80 MG tablet Take 40 mg by mouth every evening.    Yes [provider]  cefpodoxime (VANTIN) 200 MG tablet Take 1 tablet (200 mg total) by mouth every 12 (twelve) hours. Patient not taking: Reported on 07/12/2018 09/28/17   Leroy Sea, MD                                                                                                                                    Past Surgical History Past Surgical History:  Procedure Laterality Date  . APPENDECTOMY    . breast tumor     benign  . CARDIAC CATHETERIZATION    . CATARACT EXTRACTION, BILATERAL    . CHOLECYSTECTOMY    . FOOT SURGERY    . TUBAL  LIGATION     Family History Family History  Problem Relation Age of Onset  . Diabetes Mother   . Heart attack Mother   . Heart disease Father   . Breast cancer Sister   . Cancer Brother   . Diabetes Brother   . Diabetes Sister   . Suicidality Child     Social History Social History   Tobacco Use  . Smoking status: Current Every Day Smoker    Types: E-cigarettes  . Smokeless tobacco: Current User  . Tobacco comment: quit cigarettes in 2014; now vape  Substance Use Topics  . Alcohol use: No    Alcohol/week: 0.0 oz  . Drug use: No   Allergies Iodine; Novocain [procaine]; and Neosporin [neomycin-bacitracin zn-polymyx]  Review of Systems Review of Systems All other systems are reviewed and are negative for acute change except as noted in the HPI  Physical Exam Vital Signs  I have reviewed the triage vital signs BP (!) 138/54   Pulse (!) 55   Temp 97.9 F (36.6 C) (Oral)   Resp 17   SpO2 92%   Physical Exam  Constitutional: She is oriented to person, place, and time. She appears well-developed and well-nourished. No distress.  HENT:  Head: Normocephalic. Head is with laceration ( Small 3 mm long by less than 1 mm deep laceration to the left upper lip.).    Right Ear: External ear normal.  Left Ear: External ear normal.  Nose: Nose normal.  Eyes: Pupils are equal, round, and reactive to light. Conjunctivae and EOM are normal. Right eye exhibits no discharge. Left eye exhibits no discharge. No scleral icterus.  Neck: Normal range of motion. Neck supple. No spinous process tenderness and no muscular tenderness present.  Cardiovascular: Normal rate, regular rhythm and normal heart sounds. Exam reveals no gallop and no friction rub.  No murmur heard. Pulses:      Radial pulses are 2+ on the right side, and 2+ on the left side.       Dorsalis pedis pulses are 2+ on the right side, and 2+ on the left side.  Pulmonary/Chest: Effort normal and breath sounds normal. No  stridor. No respiratory distress. She has no wheezes.  Abdominal: Soft. She  exhibits no distension. There is no tenderness.  Musculoskeletal: She exhibits no edema.       Left knee: Tenderness found.       Left ankle: Tenderness.       Cervical back: She exhibits no bony tenderness.       Thoracic back: She exhibits no bony tenderness.       Lumbar back: She exhibits no bony tenderness.       Left hand: She exhibits tenderness. She exhibits normal range of motion.       Hands: Clavicles stable. Chest stable to AP/Lat compression. Pelvis stable to Lat compression. No obvious extremity deformity. No chest or abdominal wall contusion.  Neurological: She is alert and oriented to person, place, and time.  Moving all extremities  Skin: Skin is warm and dry. No rash noted. She is not diaphoretic. No erythema.  Psychiatric: She has a normal mood and affect.    ED Results and Treatments Labs (all labs ordered are listed, but only abnormal results are displayed) Labs Reviewed  CBC WITH DIFFERENTIAL/PLATELET - Abnormal; Notable for the following components:      Result Value   Hemoglobin 11.2 (*)    All other components within normal limits  COMPREHENSIVE METABOLIC PANEL - Abnormal; Notable for the following components:   Glucose, Bld 158 (*)    Creatinine, Ser 1.15 (*)    Total Protein 6.0 (*)    Albumin 3.2 (*)    GFR calc non Af Amer 43 (*)    GFR calc Af Amer 50 (*)    All other components within normal limits  CBG MONITORING, ED - Abnormal; Notable for the following components:   Glucose-Capillary 194 (*)    All other components within normal limits  CBG MONITORING, ED - Abnormal; Notable for the following components:   Glucose-Capillary 116 (*)    All other components within normal limits  PROTIME-INR                                                                                                                         EKG  EKG Interpretation  Date/Time:    Ventricular  Rate:    PR Interval:    QRS Duration:   QT Interval:    QTC Calculation:   R Axis:     Text Interpretation:        Radiology Dg Wrist Complete Right  Result Date: 07/12/2018 CLINICAL DATA:  Fall with laceration to right wrist. EXAM: RIGHT WRIST - COMPLETE 3+ VIEW COMPARISON:  None. FINDINGS: There is no evidence of fracture. There is widening of the scapholunate interval at 5 mm. Calcification in the region of the triangular fibrocartilage. Soft tissues are unremarkable. IMPRESSION: 1. No acute fracture. 2. Widening of the scapholunate interval suggests ligamentous injury, this may be acute or chronic. Electronically Signed   By: Rubye Oaks M.D.   On: 07/12/2018 05:19   Dg Ankle Complete Left  Result Date: 07/12/2018 CLINICAL DATA:  Fall with left ankle pain. EXAM: LEFT ANKLE COMPLETE - 3+ VIEW COMPARISON:  None. FINDINGS: Posterior aspect of the heel excluded on the lateral view. Overlying artifact from patient's sock also limits evaluation. Allowing for this, no evidence of acute fracture. The ankle mortise is preserved. There is an incidental os trigonum. No visualized joint effusion. IMPRESSION: No fracture or dislocation of the left ankle. Electronically Signed   By: Rubye Oaks M.D.   On: 07/12/2018 05:20   Ct Head Wo Contrast  Result Date: 07/12/2018 CLINICAL DATA:  Dizziness getting up to use the bathroom tonight leading to fall. Hematoma about right eye. Headache. EXAM: CT HEAD WITHOUT CONTRAST CT MAXILLOFACIAL WITHOUT CONTRAST TECHNIQUE: Multidetector CT imaging of the head and maxillofacial structures were performed using the standard protocol without intravenous contrast. Multiplanar CT image reconstructions of the maxillofacial structures were also generated. COMPARISON:  Head and cervical spine CT 03/06/2018 FINDINGS: CT HEAD FINDINGS Brain: Acute right subdural hematoma about the right frontotemporal lobe measures up to 7 mm in thickness. Minimal subdural blood along  the falx superiorly. No significant midline shift. Unchanged general atrophy and chronic small vessel ischemia. No hydrocephalus, the basilar cisterns are patent. Vascular: Atherosclerosis of skullbase vasculature without hyperdense vessel or abnormal calcification. Skull: No fracture or focal lesion. Other: Large right frontal scalp hematoma. CT MAXILLOFACIAL FINDINGS Osseous: Nasal bone, zygomatic arches, and mandibles are intact. The temporomandibular joints are congruent. Patient is edentulous. Orbits: No orbital fracture. Both orbits and globes are intact. Prior bilateral cataract resection. Sinuses: Minimal mucosal thickening. No fluid level or sinus fracture. Soft tissues: Soft tissue edema and hematoma overlying the left cheek. IMPRESSION: 1. Small to moderate-sized acute right subdural hematoma about the right frontotemporal lobe measuring up to 7 mm. No significant midline shift. No skull fracture. 2. Left facial contusion and hematoma without facial bone fracture. Critical Value/emergent results were called by telephone at the time of interpretation on 07/12/2018 at 4:01 am to Dr. Drema Pry , who verbally acknowledged these results. Electronically Signed   By: Rubye Oaks M.D.   On: 07/12/2018 04:02   Dg Knee Complete 4 Views Left  Result Date: 07/12/2018 CLINICAL DATA:  Fall EXAM: LEFT KNEE - COMPLETE 4+ VIEW COMPARISON:  None. FINDINGS: No acute displaced fracture or malalignment. No significant knee effusion. Vascular calcifications. Chondrocalcinosis. IMPRESSION: 1. No acute osseous abnormality. 2. Chondrocalcinosis Electronically Signed   By: Jasmine Pang M.D.   On: 07/12/2018 03:33   Ct Maxillofacial Wo Contrast  Result Date: 07/12/2018 CLINICAL DATA:  Dizziness getting up to use the bathroom tonight leading to fall. Hematoma about right eye. Headache. EXAM: CT HEAD WITHOUT CONTRAST CT MAXILLOFACIAL WITHOUT CONTRAST TECHNIQUE: Multidetector CT imaging of the head and maxillofacial  structures were performed using the standard protocol without intravenous contrast. Multiplanar CT image reconstructions of the maxillofacial structures were also generated. COMPARISON:  Head and cervical spine CT 03/06/2018 FINDINGS: CT HEAD FINDINGS Brain: Acute right subdural hematoma about the right frontotemporal lobe measures up to 7 mm in thickness. Minimal subdural blood along the falx superiorly. No significant midline shift. Unchanged general atrophy and chronic small vessel ischemia. No hydrocephalus, the basilar cisterns are patent. Vascular: Atherosclerosis of skullbase vasculature without hyperdense vessel or abnormal calcification. Skull: No fracture or focal lesion. Other: Large right frontal scalp hematoma. CT MAXILLOFACIAL FINDINGS Osseous: Nasal bone, zygomatic arches, and mandibles are intact. The temporomandibular joints are congruent. Patient is edentulous. Orbits: No orbital fracture. Both orbits and globes are intact. Prior bilateral  cataract resection. Sinuses: Minimal mucosal thickening. No fluid level or sinus fracture. Soft tissues: Soft tissue edema and hematoma overlying the left cheek. IMPRESSION: 1. Small to moderate-sized acute right subdural hematoma about the right frontotemporal lobe measuring up to 7 mm. No significant midline shift. No skull fracture. 2. Left facial contusion and hematoma without facial bone fracture. Critical Value/emergent results were called by telephone at the time of interpretation on 07/12/2018 at 4:01 am to Dr. Drema PryPEDRO Asaad Gulley , who verbally acknowledged these results. Electronically Signed   By: Rubye OaksMelanie  Ehinger M.D.   On: 07/12/2018 04:02   Pertinent labs & imaging results that were available during my care of the patient were reviewed by me and considered in my medical decision making (see chart for details).  Medications Ordered in ED Medications  fentaNYL (SUBLIMAZE) injection 50 mcg (50 mcg Intravenous Given 07/12/18 0654)                                                                                                                                     Procedures Procedures CRITICAL CARE Performed by: Amadeo GarnetPedro Eduardo Deshawnda Acrey Total critical care time: 40 minutes Critical care time was exclusive of separately billable procedures and treating other patients. Critical care was necessary to treat or prevent imminent or life-threatening deterioration. Critical care was time spent personally by me on the following activities: development of treatment plan with patient and/or surrogate as well as nursing, discussions with consultants, evaluation of patient's response to treatment, examination of patient, obtaining history from patient or surrogate, ordering and performing treatments and interventions, ordering and review of laboratory studies, ordering and review of radiographic studies, pulse oximetry and re-evaluation of patient's condition.   (including critical care time)  Medical Decision Making / ED Course I have reviewed the nursing notes for this encounter and the patient's prior records (if available in EHR or on provided paperwork).  Clinical Course as of Jul 12 821  Wynelle LinkSun Jul 12, 2018  [id number] Fall resulting in head trauma and extremity injuries. CT head, face ordered.  Plain films of the extremities ordered.   [PC]  0500 CT head notable for right-sided 7 mm subdural.  No facial injuries.  Plain films of the extremities did not reveal any obvious injuries.  Screening labs reassuring.   [PC]  [phone] Spoke with neurosurgery who will come and evaluate the patient   [PC]  [phone] Neurosurgery evaluated the patient and recommended admission.  Due to comorbidities to request medicine admission.   [PC]  [phone] Internal medicine will admit the patient.   [PC]    Clinical Course User Index [PC] Favor Hackler, Amadeo GarnetPedro Eduardo, MD     Final Clinical Impression(s) / ED Diagnoses Final diagnoses:  Fall  Subdural hematoma (HCC)  Facial hematoma,  initial encounter  Right wrist pain  Acute pain of left knee  Acute left ankle pain  Skin tear of left hand without complication, initial encounter  This chart was dictated using voice recognition software.  Despite best efforts to proofread,  errors can occur which can change the documentation meaning.   Nira Conn, MD 07/12/18 [phone]

## 2018-07-12 NOTE — ED Notes (Signed)
Attempoted to call report  

## 2018-07-13 ENCOUNTER — Other Ambulatory Visit: Payer: Self-pay

## 2018-07-13 DIAGNOSIS — E119 Type 2 diabetes mellitus without complications: Secondary | ICD-10-CM

## 2018-07-13 DIAGNOSIS — G231 Progressive supranuclear ophthalmoplegia [Steele-Richardson-Olszewski]: Secondary | ICD-10-CM

## 2018-07-13 DIAGNOSIS — S0003XA Contusion of scalp, initial encounter: Secondary | ICD-10-CM

## 2018-07-13 DIAGNOSIS — Z79899 Other long term (current) drug therapy: Secondary | ICD-10-CM

## 2018-07-13 DIAGNOSIS — Z7984 Long term (current) use of oral hypoglycemic drugs: Secondary | ICD-10-CM

## 2018-07-13 DIAGNOSIS — W01198A Fall on same level from slipping, tripping and stumbling with subsequent striking against other object, initial encounter: Secondary | ICD-10-CM

## 2018-07-13 DIAGNOSIS — Z91041 Radiographic dye allergy status: Secondary | ICD-10-CM

## 2018-07-13 DIAGNOSIS — M810 Age-related osteoporosis without current pathological fracture: Secondary | ICD-10-CM

## 2018-07-13 DIAGNOSIS — Z9181 History of falling: Secondary | ICD-10-CM

## 2018-07-13 DIAGNOSIS — Z7982 Long term (current) use of aspirin: Secondary | ICD-10-CM

## 2018-07-13 DIAGNOSIS — I10 Essential (primary) hypertension: Secondary | ICD-10-CM

## 2018-07-13 DIAGNOSIS — Z881 Allergy status to other antibiotic agents status: Secondary | ICD-10-CM

## 2018-07-13 DIAGNOSIS — Z884 Allergy status to anesthetic agent status: Secondary | ICD-10-CM

## 2018-07-13 DIAGNOSIS — S065X0A Traumatic subdural hemorrhage without loss of consciousness, initial encounter: Secondary | ICD-10-CM

## 2018-07-13 DIAGNOSIS — Z87310 Personal history of (healed) osteoporosis fracture: Secondary | ICD-10-CM

## 2018-07-13 LAB — CBC
HCT: 33.1 % — ABNORMAL LOW (ref 36.0–46.0)
Hemoglobin: 10.2 g/dL — ABNORMAL LOW (ref 12.0–15.0)
MCH: 27.6 pg (ref 26.0–34.0)
MCHC: 30.8 g/dL (ref 30.0–36.0)
MCV: 89.7 fL (ref 78.0–100.0)
Platelets: 209 10*3/uL (ref 150–400)
RBC: 3.69 MIL/uL — ABNORMAL LOW (ref 3.87–5.11)
RDW: 14.6 % (ref 11.5–15.5)
WBC: 4.9 10*3/uL (ref 4.0–10.5)

## 2018-07-13 LAB — BASIC METABOLIC PANEL
Anion gap: 8 (ref 5–15)
BUN: 14 mg/dL (ref 8–23)
CO2: 23 mmol/L (ref 22–32)
Calcium: 9.9 mg/dL (ref 8.9–10.3)
Chloride: 112 mmol/L — ABNORMAL HIGH (ref 98–111)
Creatinine, Ser: 0.98 mg/dL (ref 0.44–1.00)
GFR calc Af Amer: >60 mL/min (ref >60)
GFR calc non Af Amer: 52 mL/min — ABNORMAL LOW (ref >60)
Glucose, Bld: 131 mg/dL — ABNORMAL HIGH (ref 70–99)
Potassium: 3.6 mmol/L (ref 3.5–5.1)
Sodium: 143 mmol/L (ref 135–145)

## 2018-07-13 LAB — URINALYSIS, ROUTINE W REFLEX MICROSCOPIC
Bilirubin Urine: NEGATIVE
Glucose, UA: 50 mg/dL — AB
Ketones, ur: NEGATIVE mg/dL
Leukocytes, UA: NEGATIVE
Nitrite: NEGATIVE
Protein, ur: 100 mg/dL — AB
Specific Gravity, Urine: 1.017 (ref 1.005–1.030)
pH: 5 (ref 5.0–8.0)

## 2018-07-13 LAB — GLUCOSE, CAPILLARY: Glucose-Capillary: 162 mg/dL — ABNORMAL HIGH (ref 70–99)

## 2018-07-13 MED ORDER — ALENDRONATE SODIUM 70 MG PO TABS: 70.0000 mg | ORAL_TABLET | ORAL | 11 refills | Status: AC

## 2018-07-13 NOTE — Progress Notes (Signed)
  Date: 07/13/2018  Patient name: Angela Bandaatricia Craig Craig  Medical record number: [id number]  Date of birth: 03-24-1933   I have seen and evaluated Angela Craig and discussed their care with the Residency Team. 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  She lives with family but they are unable to be with her 24 hours Craig day.  She got up at night to go to the bathroom and felt lightheaded and tripped and fell striking her face and forehead on the ground.  She had no loss of consciousness but has had Craig headache since the fall.  Imaging in the ED showed Craig right subdural hematoma.  Neurosurgery thought observation was indicated along with repeat imaging which did not show worsening of the subdural hematoma.  This morning, she has Craig mild headache, her left leg will not obey her commands which is chronic, and she is anxious to get home.  We discussed she would have Craig physical therapy and Occupational Therapy consult and that we would maximize the amount of home services that we could obtain but that Medicare only provide services in the home short-term.  We also recommended she and her family look into PACE.  T max 98.5 HR 53 - 69 RR 18 BP 125/64 - 163/54 Neg orthostatics 97% on RA Gen elderly female in NAD in recliner HRRR no MRG LCTAB with good air flow ABD + BS Neuro per Dr Murrell ReddenBloomfield's note  Hemoglobin 11.2 -10.2 today UA clean-catch, 100 protein, small hemoglobin, 0-5 RBCs, negative nitrite, negative leukocyte esterase  I personally viewed the L knee images and confirmed my reading with the official read.  No fracture, vascular calcifications  I personally viewed the EKG and confirmed my reading with the official read. Sinus brady, nl axis, no ischemic changes  CT head : 7 mm right subdural hematoma  Assessment and Plan: I have seen and evaluated the patient as outlined above. 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  She presented after Craig mechanical fall causing Craig right subdural hematoma which has not progressed overnight.  We have also held her baby aspirin.  PT and OT recommend continued home services.  She requires no further inpatient management for her fall and subdural hematoma.  1.  Subdural hematoma secondary to mechanical fall -stable for discharge to home off her baby aspirin.  2.  Frequent falls secondary to progressive super nuclear palsy -she sees neurology as an outpatient and we encouraged her to follow-up with her neurologist.  She is in the process of obtaining Craig power chair although that will not mitigate her fall risk entirely.  We will arrange for home PT and OT services to continue and also Craig short-term aide.  We also encouraged the patient and the family to investigate PACE.  3.  Osteoporosis diagnosed by fragility fractures in September 2018 -she had Craig fall in September 2018 that resulted in displaced rib fractures.  This would be diagnostic of osteoporosis.  She is already on calcium and vitamin D and we encouraged her to start alendronate weekly once she is discharged.  D/C home with PT, OT, aide.  Burns SpainButcher, Perry Brucato A, MD 7/29/20191:43 PM

## 2018-07-13 NOTE — Progress Notes (Signed)
Patient ID: Angela Craig, female   DOB: 07/03/1933, 84 y.o.   MRN: [id number] Patient doing well neurologically  Repeat CT stable patient may be discharged when cleared from medicine.

## 2018-07-13 NOTE — Evaluation (Signed)
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 She was found to have small R subdural hematoma as well as potential R wrist injury.   Clinical Impression   PTA, pt was independent with RW for basic ADL but has a history of multiple falls, up to one each day. Pt currently requiring max assist for LB ADL avoiding bending over due to small SDH. Educated pt concerning use of reacher, sock-aid, long-handled shoe horn to improve independence and safety. Pt requiring close min guard assist for functional mobility for standing grooming and simulated toilet transfers. She additionally presents with double vision at baseline. Pt does have prism in glasses (temporary as of now but anticipating permanent placement by opthamologist). Recommended pt wear glasses during all mobility to minimize effects of double vision on safety. Pt would benefit from continued OT services while admitted to improve independence and safety with ADL and functional mobility. Recommend home health OT follow-up post-acute D/C to maximize safety and independence in her home environment. OT will continue to follow while admitted.     Follow Up Recommendations  Home health OT;Supervision/Assistance - 24 hour    Equipment Recommendations  None recommended by OT    Recommendations for Other Services       Precautions / Restrictions Precautions Precautions: Fall Restrictions Weight Bearing Restrictions: No      Mobility Bed Mobility               General bed mobility comments: up in chair upon arrival  Transfers Overall transfer level: Needs assistance Equipment used: Rolling walker (2 wheeled) Transfers: Sit to/from Stand Sit to Stand: Min  guard         General transfer comment: Min gaurd for safety and stability upon coming to standing. Increased time and effort to power up.    Balance Overall balance assessment: History of Falls                                         ADL either performed or assessed with clinical judgement   ADL Overall ADL's : Needs assistance/impaired Eating/Feeding: Set up;Sitting   Grooming: Min guard;Standing   Upper Body Bathing: Supervision/ safety;Sitting   Lower Body Bathing: Sit to/from stand;Maximal assistance   Upper Body Dressing : Supervision/safety;Sitting   Lower Body Dressing: Maximal assistance;Sit to/from stand Lower Body Dressing Details (indicate cue type and reason): Discussed use of shoe horn, sock aide, and reacher.  Toilet Transfer: Min guard;Ambulation;RW Toilet Transfer Details (indicate cue type and reason): Min guard assist for toilet transfers. She moves slowly and laboriously.  Toileting- Clothing Manipulation and Hygiene: Minimal assistance;Sit to/from stand       Functional mobility during ADLs: Min guard;Rolling walker General ADL Comments: Pt and family educated concerning home set-up to maximize safety including taking up throw rugs and placing bright tape over transitions. Educated on recommendation to obtain long handled dhoe horn as well as use of sock aide and reacher that she already has at home.      Vision Baseline Vision/History: Wears glasses Wears Glasses: At all times Patient Visual Report: Diplopia(Diplopia at baseline; has temporary prism in glasses) Vision Assessment?: Yes Eye Alignment: Within Functional  Limits Ocular Range of Motion: Within Functional Limits Alignment/Gaze Preference: Within Defined Limits Tracking/Visual Pursuits: Able to track stimulus in all quads without difficulty Saccades: Within functional limits Convergence: Within functional limits Visual Fields: No apparent deficits Diplopia Assessment:  Objects split side to side;Present all the time/all directions(has prism) Additional Comments: Pt has prism in L lens of glasses that is temporary. Pt planning to pursue permanent. Prism eliminates diplopia.      Perception     Praxis      Pertinent Vitals/Pain Pain Assessment: No/denies pain     Hand Dominance     Extremity/Trunk Assessment Upper Extremity Assessment Upper Extremity Assessment: Generalized weakness   Lower Extremity Assessment Lower Extremity Assessment: Generalized weakness LLE Deficits / Details: LLE limited joint mobility LLE Coordination: decreased gross motor;decreased fine motor       Communication Communication Communication: HOH(soft spoke today due to pain in ribs)   Cognition Arousal/Alertness: Awake/alert Behavior During Therapy: WFL for tasks assessed/performed Overall Cognitive Status: Impaired/Different from baseline Area of Impairment: Safety/judgement;Problem solving                         Safety/Judgement: Decreased awareness of safety;Decreased awareness of deficits   Problem Solving: Slow processing General Comments: Increased time required for processing.    General Comments  Educated on need for 24 hour supervision/assistance. Educated on need to wear glasses at all times to eliminate diplopia.    Exercises     Shoulder Instructions      Home Living Family/patient expects to be discharged to:: Private residence Living Arrangements: Children Available Help at Discharge: Family Type of Home: House Home Access: Ramped entrance     Home Layout: One level     Bathroom Shower/Tub: Producer, television/film/videoWalk-in shower   Bathroom Toilet: Standard     Home Equipment: Environmental consultantWalker - 2 wheels;Walker - 4 wheels;Bedside commode;Wheelchair - manual;Grab bars - toilet;Grab bars - tub/shower          Prior Functioning/Environment Level of Independence: Independent with assistive device(s)        Comments: pt used RW in the home (had become  increasingly more difficult and was active with HHPT at this time) , children assist however pt alone during the day.         OT Problem List: Decreased strength;Decreased range of motion;Decreased activity tolerance;Impaired balance (sitting and/or standing);Decreased safety awareness;Impaired vision/perception;Decreased coordination;Decreased cognition;Decreased knowledge of use of DME or AE;Decreased knowledge of precautions;Impaired UE functional use;Pain      OT Treatment/Interventions: Self-care/ADL training;Therapeutic exercise;Energy conservation;DME and/or AE instruction;Therapeutic activities;Patient/family education;Balance training    OT Goals(Current goals can be found in the care plan section) Acute Rehab OT Goals Patient Stated Goal: to go home OT Goal Formulation: With patient/family Time For Goal Achievement: 07/27/18 Potential to Achieve Goals: Good ADL Goals Pt Will Perform Grooming: with supervision;standing Pt Will Perform Lower Body Bathing: with supervision;sit to/from stand;with adaptive equipment Pt Will Perform Lower Body Dressing: with supervision;with adaptive equipment;sit to/from stand Pt Will Transfer to Toilet: with supervision;ambulating;regular height toilet Pt Will Perform Toileting - Clothing Manipulation and hygiene: with supervision;sit to/from stand  OT Frequency: Min 2X/week   Barriers to D/C:            Co-evaluation PT/OT/SLP Co-Evaluation/Treatment: Yes Reason for Co-Treatment: For patient/therapist safety PT goals addressed during session: Mobility/safety with mobility OT goals addressed during session: ADL's and self-care      AM-PAC PT "6 Clicks" Daily Activity     Outcome  Measure Help from another person eating meals?: None Help from another person taking care of personal grooming?: A Little Help from another person toileting, which includes using toliet, bedpan, or urinal?: A Little Help from another person bathing (including  washing, rinsing, drying)?: A Lot Help from another person to put on and taking off regular upper body clothing?: A Little Help from another person to put on and taking off regular lower body clothing?: A Lot 6 Click Score: 17   End of Session Equipment Utilized During Treatment: Gait belt;Rolling walker Nurse Communication: Mobility status  Activity Tolerance: Patient tolerated treatment well Patient left: in chair;with call bell/phone within reach;with family/visitor present  OT Visit Diagnosis: Other abnormalities of gait and mobility (R26.89);Low vision, both eyes (H54.2)                Time: 8657-8469 OT Time Calculation (min): 32 min Charges:  OT General Charges $OT Visit: 1 Visit OT Evaluation $OT Eval Moderate Complexity: 1 Mod  Doristine Section, MS OTR/L  Pager: [phone]   Angela Craig 07/13/2018, 1:05 PM

## 2018-07-13 NOTE — Progress Notes (Signed)
Pt discharged home. Pt verbalizes understanding of AVS instructions. Daughter transporting pt home. Pt escorted out via staff.

## 2018-07-13 NOTE — Evaluation (Signed)
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 Patient demonstrates deficits in functional mobility as indicated below. Will benefit from continued skilled PT to address deficits and maximize function. Patient planning to d/c today, therefore will defer all further needs to HHPT.      Follow Up Recommendations Home health PT;Supervision/Assistance - 24 hour(HH aide)    Equipment Recommendations  (may need to consider power w/c re PSP)    Recommendations for Other Services       Precautions / Restrictions Precautions Precautions: Fall      Mobility  Bed Mobility               General bed mobility comments: up in chair upon arrival  Transfers Overall transfer level: Needs assistance Equipment used: Rolling walker (2 wheeled) Transfers: Sit to/from Stand Sit to Stand: Min guard         General transfer comment: Min gaurd for safety and stability upon coming to standing. Increased time and effort to power up.  Ambulation/Gait Ambulation/Gait assistance: Min guard Gait Distance (Feet): 40 Feet Assistive device: Rolling walker (2 wheeled) Gait Pattern/deviations: Step-through pattern;Decreased stride length;Shuffle;Drifts right/left;Decreased dorsiflexion - left;Narrow base of support;Trunk flexed Gait velocity: decreased Gait velocity interpretation: <1.31 ft/sec, indicative of household ambulator General Gait Details: patient with noted instability during ambulation, min guard to min assist decreased ROM and clearance LLE, patient with circumduction compensation.  Stairs            Wheelchair Mobility    Modified Rankin (Stroke  Patients Only)       Balance Overall balance assessment: History of Falls                                           Pertinent Vitals/Pain Pain Assessment: No/denies pain    Home Living Family/patient expects to be discharged to:: Private residence Living Arrangements: Children Available Help at Discharge: Family Type of Home: House Home Access: [place]: One [place]: Environmental consultant - 2 wheels;Walker - 4 wheels;Bedside commode;Wheelchair - manual      Prior Function Level of Independence: Independent with assistive device(s)         Comments: pt used RW in the home (had become increasingly more difficult and was active with HHPT at this time) , children assist however pt alone during the day.      Hand Dominance        Extremity/Trunk Assessment   Upper Extremity Assessment Upper Extremity Assessment: Generalized weakness    Lower Extremity Assessment Lower Extremity Assessment: Generalized weakness;LLE deficits/detail LLE Deficits / Details: LLE limited joint mobility LLE Coordination: decreased gross motor;decreased fine motor       Communication   Communication: HOH(soft spoke today due to pain in ribs)  Cognition Arousal/Alertness: Awake/alert Behavior During Therapy: WFL for tasks assessed/performed Overall Cognitive Status: Impaired/Different from baseline Area of Impairment: Safety/judgement                         Safety/Judgement: Decreased awareness of safety;Decreased awareness of deficits  General Comments      Exercises     Assessment/Plan    PT Assessment All further PT needs can be met in the next venue of care  PT Problem List Decreased strength;Decreased activity tolerance;Decreased balance;Decreased coordination;Decreased mobility;Decreased safety awareness       PT Treatment Interventions      PT Goals (Current goals can be found in the Care Plan section)   Acute Rehab PT Goals Patient Stated Goal: to go home PT Goal Formulation: All assessment and education complete, DC therapy(defer to HHPT)    Frequency     Barriers to discharge        Co-evaluation PT/OT/SLP Co-Evaluation/Treatment: Yes Reason for Co-Treatment: For patient/therapist safety PT goals addressed during session: Mobility/safety with mobility OT goals addressed during session: ADL's and self-care       AM-PAC PT "6 Clicks" Daily Activity  Outcome Measure Difficulty turning over in bed (including adjusting bedclothes, sheets and blankets)?: A Little Difficulty moving from lying on back to sitting on the side of the bed? : A Little Difficulty sitting down on and standing up from a chair with arms (e.g., wheelchair, bedside commode, etc,.)?: A Little Help needed moving to and from a bed to chair (including a wheelchair)?: A Little Help needed walking in hospital room?: A Little Help needed climbing 3-5 steps with a railing? : A Lot 6 Click Score: 17    End of Session Equipment Utilized During Treatment: Gait belt Activity Tolerance: Patient tolerated treatment well Patient left: in chair;with call bell/phone within reach;with family/visitor present Nurse Communication: Mobility status PT Visit Diagnosis: Unsteadiness on feet (R26.81);History of falling (Z91.81);Difficulty in walking, not elsewhere classified (R26.2)    Time: 1219-1234 PT Time Calculation (min) (ACUTE ONLY): 15 min   Charges:   PT Evaluation $PT Eval Moderate Complexity: 1 Mod          Devon Werner, PT DPT  Board Certified Neurologic Specialist [phone]   Devon J Werner 07/13/2018, 12:48 PM   

## 2018-07-13 NOTE — Care Management Note (Addendum)
Case Management Note Original note by: Lawerance Sabalebbie Swist, RN 07/12/2018, 11:11 AM    Patient Details  Name: Angela Craig MRN: [id number] Date of Birth: 1933/07/10  Subjective/Objective:                 Spoke w patient's daughter who states that she is active w [facility]. Notified Alvino ChapelEllen w [facility], she is verifying, and return call w disciplines she is active with.  Ray,[name] Daughter [phone]   Action/Plan: Expected Discharge Date:  07/13/18               Expected Discharge Plan:  Home w Home Health Services  In-House Referral:     Discharge planning Services  CM Consult  Post Acute Care Choice:  Home Health, Resumption of Svcs/PTA Provider Choice offered to:  Adult Children  DME Arranged:    DME Agency:     HH Arranged:  RN, Nurse's Aide, Social Work, PT, OT(Telemonitoring) HH Agency:  [facility]  Status of Service:  Completed, signed off  If discussed at [employer] Meetings, dates discussed:    Additional Comments:  07/13/18 J. Guadalupe Nickless, RN, BSN Pt medically stable for dc home today.  Resumption of homecare orders obtained from provider.  Notified HH agency of orders and dc date.    Glennon MacAmerson, Geral Coker M, RN 07/13/2018, 3:51 PM

## 2018-07-13 NOTE — Progress Notes (Signed)
   Subjective: [name] was seen and evaluated at bedside. She complains of continued headache, but has not noticed it get any worse. Tylenol has not helped much. We discussed that her repeat CT shows improving subdural hematoma.  Objective:  Vital signs in last 24 hours: Vitals:   07/13/18 0300 07/13/18 0910 07/13/18 0911 07/13/18 0912  BP: (!) 152/64 (!) 153/54 (!) 163/54 (!) 158/69  Pulse: (!) 57     Resp: 18     Temp: 97.8 F (36.6 C)     TempSrc: Oral     SpO2: 97%      Physical Exam General: awake, alert pleasant female sitting up in bed in NAD HEENT: right forehead with scalp hematoma; large left maxillary facial hematoma  CV: RRR; no murmurs, gallops or rubs. Distal pulses intact bilaterally Abdominal: BS+, abdomen is soft, non-tender, non-distended Neuro: A&Ox3. Cns II-XII intact. Strength is 4/5 in LLE; 5/5 in the rest of her extremities. Decreased sensation in LLE; otherwise intact throughout.  Skin: multiple bruises on forearms from previous falls  Assessment/Plan:  Active Problems:   Subdural hematoma (HCC)  1. Subdural hematoma: secondary to mechanical fall in home. No LOC. Patient is alert and oriented without acute neurologic deficits. Repeat CT shows improving subdural hematoma. She is stable for discharge. Will hold Aspirin and NSAIDs. Encouraged Tylenol prn for headache.   2. Frequent falls with recently diagnosed PSP: Patient reports falling nearly every day for several weeks. She is being followed by outpatient neurology; currently working on getting motorized wheelchair. They also recommended possible Levadopa trial, but have not started any medical therapy yet. PT/OT evaluated her and recommended continued home PT and home health aid which has been ordered.   3. HTN: patient on benazepril 40 mg, Chlorthalidone 25 mg, and Lasix 20 mg at home. Concern for orthostatic hypotension, but orthostatics negative. Will continue home regimen and have her follow-up with  PCP to adjust as needed.   4. DM: on Victoza and Metformin 500 mg. Blood sugars stable in 100s. Have placed on sensitive sliding scale with CBG checks 4x daily.   5. Osteoporosis: had fall in 08/2017 where she broke her ribs which is considered a fragility fracture. She is on calcium and vitamin D. Discussed started alendronate once weekly once she is discharged.    Dispo: Anticipated discharge home today.   Lenward ChancellorBloomfield, Ward Boissonneault D, DO 07/13/2018, 5:03 PM Pager: [phone]

## 2018-07-13 NOTE — Discharge Summary (Signed)
Name: Angela Craig MRN: [id number] DOB: 1933/02/24 84 y.o. PCP: Angela Davenport, MD  Date of Admission: 07/12/2018  2:50 AM Date of Discharge: 07/13/2018 Attending Physician: No att. providers found  Discharge Diagnosis: 1. Subdural hematomas 2. PSP 3. Osteoporosis   Discharge Medications: Allergies as of 07/13/2018      Reactions   Iodine Other (See Comments)   Unknown    Novocain [procaine] Other (See Comments)   Convulsions    Neosporin [neomycin-bacitracin Zn-polymyx] Rash      Medication List    STOP taking these medications   aspirin 81 MG tablet   ibuprofen 600 MG tablet Commonly known as:  ADVIL,MOTRIN     TAKE these medications   alendronate 70 MG tablet Commonly known as:  FOSAMAX Take 1 tablet (70 mg total) by mouth every 7 (seven) days. Take with a full glass of water on an empty stomach.   benazepril 40 MG tablet Commonly known as:  LOTENSIN Take 40 mg by mouth daily.   chlorthalidone 25 MG tablet Commonly known as:  HYGROTON Take 25 mg by mouth daily.   clobetasol cream 0.05 % Commonly known as:  TEMOVATE Apply 1 application topically daily as needed (eczema).   diclofenac sodium 1 % Gel Commonly known as:  VOLTAREN Apply 2 g topically daily as needed (pain).   esomeprazole 40 MG capsule Commonly known as:  NEXIUM Take 40 mg by mouth daily.   fluticasone 50 MCG/ACT nasal spray Commonly known as:  FLONASE Place 1 spray into both nostrils daily.   furosemide 20 MG tablet Commonly known as:  LASIX Take 20 mg by mouth daily.   metFORMIN 500 MG tablet Commonly known as:  GLUCOPHAGE Take 500 mg by mouth daily with breakfast.   metoprolol tartrate 50 MG tablet Commonly known as:  LOPRESSOR Take 50 mg by mouth 2 (two) times daily.   multivitamin tablet Take 1 tablet by mouth daily.   oxybutynin 5 MG 24 hr tablet Commonly known as:  DITROPAN-XL Take 5 mg by mouth daily.   simvastatin 80 MG tablet Commonly known as:   ZOCOR Take 40 mg by mouth every evening.   VICTOZA 18 MG/3ML Sopn Generic drug:  liraglutide Inject 1.2 mg into the skin daily.   Vitamin D3 2000 units Tabs Take 2,000 Units by mouth daily.       Disposition and follow-up:   Ms.Angela Craig was discharged from [facility] in Stable condition.  At the hospital follow up visit please address:  1.  [name] was seen after a mechanical fall in her home. CT head showed 2 small subdural hematomas that did not require neurosurgical intervention. She was monitored overnight with repeat CT head done 6 hours later that showed hematomas were stable and getting smaller. Her aspirin was held at discharge.  Osteoporosis: it was noted that she had a fall in 08/2017 where she had fragility fractures with broken ribs. She was already on Calcium and Vit D, but we added weekly alendronate at discharge. PT and home health aid were ordered at discharge. Please sure they have everything in place that they need.   2.  Labs / imaging needed at time of follow-up: none  3.  Pending labs/ test needing follow-up: none Follow-up Appointments: Follow-up Information    Angela Davenport, MD. Schedule an appointment as soon as possible for a visit in 1 week(s).   Specialty:  Family Medicine Contact information: [address] [place] [id number] [phone]           Hospital Course by problem list: 1. Subdural hematomas after mechanical fall in her home. [name] presented to the ED after she had a ground level mechanical fall trying to get to the bathroom. She reports feeling lightheaded and fell forward striking her face and head on the ground. There was no LOC. She complained of a headache, L ankle pain, L knee pain, and R wrist pain after the fall. X-rays were done for joint complaints which were negative for fracture. CT head showed a 7.2 mm right sided subdural hematoma and 3.2 mm left-sided hematoma. She was fully  alert and oriented without neuro deficits. Neurosurgery was consulted, and they recommended overnight observation with follow-up CT scan in 6-8 hours. Repeat CT showed stable and improving subdural hematomas. The next morning she still complained of the same headache, but was ready to go home. She had been taking aspirin at home. This was discontinued. We also encouraged to take Tylenol as needed for pain, instead of NSAIDs.   2. PSP with history of recurrent falls. 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 Patient reports falling nearly every day for several months. She is currently in the process of getting a motorized wheelchari. They also recommended possible Levadopa trial but have not started any medical therapy yet. PT/OT evaluated her inpatient and recommended continued home PT which she has been doing twice a week, as well as a home health aid to help her when family isn't around.   3. Osteoporosis: Per chart review, she had a fall in 08/2017 where she broke her ribs which is considered a fragility fracture. She is on Calcium and Vitamin D. We discussed starting her on weekly alendronate as well.   Discharge Vitals:   BP (!) 158/69   Pulse (!) 57   Temp 97.8 F (36.6 C) (Oral)   Resp 18   SpO2 97%   Pertinent Labs, Studies, and Procedures:  CT head IMPRESSION: 1. The known right-sided subdural hematoma is stable to slightly smaller in the interval, measuring 5.7 mm on this study versus 7.2 mm previously. There may be a tiny amount of blood associated with the falx, also stable. 2. There is also a small left subdural hematoma measuring up to 3.2 mm which is unchanged. 3. No midline shift. 4. Hematomas over the scalp as above.  Discharge Instructions: Discharge Instructions    Call MD for:  difficulty breathing, headache or visual disturbances   Complete by:  As directed    Call MD for:  extreme fatigue   Complete by:  As directed    Call MD  for:  hives   Complete by:  As directed    Call MD for:  persistant dizziness or light-headedness   Complete by:  As directed    Call MD for:  persistant nausea and vomiting   Complete by:  As directed    Call MD for:  redness, tenderness, or signs of infection (pain, swelling, redness, odor or green/yellow discharge around incision site)   Complete by:  As directed    Call MD for:  severe uncontrolled pain   Complete by:  As directed    Call MD for:  temperature >100.4   Complete by:  As directed    Diet - low sodium heart healthy   Complete by:  As directed    Discharge instructions   Complete by:  As directed  It was a pleasure taking care of you!  Please note we have stopped your aspirin and ibuprofen due to increased risk of bleeding. Please continue taking all other medicines as prescribed. We have been in contact with case manager about your healthcare needs at home which should be arranged for you shortly.  Please continue to follow-up with scheduled neurology appointment this week, as well as your primary care doctor within 1-2 weeks.   Increase activity slowly   Complete by:  As directed       Signed: Bridget Hartshorn, DO 07/13/2018, 8:19 PM   Pager: [phone]

## 2018-07-13 NOTE — Plan of Care (Signed)

## 2018-08-14 ENCOUNTER — Other Ambulatory Visit: Payer: Self-pay | Admitting: Family Medicine

## 2018-08-14 DIAGNOSIS — R4182 Altered mental status, unspecified: Secondary | ICD-10-CM

## 2018-11-02 NOTE — Progress Notes (Signed)
Angela Craig was seen today in the movement disorders clinic for neurologic consultation at the request of Dr. Ellan Lambert. 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  I have spoken to her referring physician on the phone about the patient and have reviewed medical records made available to me.  This patient is accompanied in the office by her daughter who supplements the history.Pt has also seen Dr. Frances Furbish at [facility] in 2016 for these symptoms, but no diagnosis was given except mild neuropathy. The patient's first symptom was falls.  Falls started about 5 years ago.  However, they have increased recently.  She falls very frequently, about 3-4 times per week.  She was in the emergency room in September, October and March, all related to falls.  She always falls backward.  She last fell this morning.  She was in the kitchen and she tried to turn and then she was "on the floor."   Specific Symptoms:  Tremor: No. Family hx of similar:  No. Voice: weaker Sleep: sleeping well  Vivid Dreams:  No.  Acting out dreams:  No. Wet Pillows: No. Postural symptoms:  Yes.    Falls?  Yes.   Bradykinesia symptoms: difficulty getting out of a chair, difficulty regaining balance and drags the L leg and shortened stride length Loss of smell:  No. Loss of taste:  Yes.   Urinary Incontinence:  Yes.  , wears pad but doesn't always need it.  Pad is damp in the morning Difficulty Swallowing:  Yes.  , some trouble with large pills; some trouble dry foods Handwriting, micrographia: Yes.   Trouble with ADL's:  No.  Trouble buttoning clothing: No. Depression:  Occasionally from being in the house so much Memory changes:  Yes.  , lives with daughter, son, grandson.   Hallucinations:  No.  visual distortions: No. N/V:  No. Lightheaded:  Yes.    Syncope: No. Diplopia:  Yes.  , started about 6 months ago and had prism put in the lens and it really helped ([facility]).  Saw Dr. Daphine Deutscher at [facility]  yesterday for diplopia and those notes are reviewed.  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  Neuroimaging of the brain has previously been performed.  It is not available for my review today.  MRI of the brain was done at [facility] on March 25, 2018.  I do not have the films.  It was reported to show atrophy and small vessel disease.  MRI of the cervical spine was performed on Apr 29, 2018.  Again, no films are made available to me.  There is no spinal cord abnormality.  There is some mild degenerative changes.  I did have the opportunity to review her head CT from March, 2019 and that was unremarkable.  MRI brain from 2016 was reviewed and demonstrated mod small vessel disease.  11/03/18 update: Patient is seen today in follow-up for PSP.  She is accompanied by her daughter who supplements the history.  Records have been reviewed since our last visit.  I started her on levodopa last visit.  She has tolerated the medication well.    She has not had hallucinations.  She has had continued falls.  She was admitted to the hospital in July, after I last saw her, after a fall.  She had not yet started the levodopa at the time of admission.  CT  of the brain showed small, bilateral subdural hematomas.  I personally reviewed those images.  Neurosurgery was consulted, but felt that no intervention was necessary.  I recommended an MBE but had thought that she was going to return to her primary neurologist, Dr. Oleh Genin, so I didn't order that. They did apparently return to him but he thought that it best to resume care here. The MBE has not been completed, therefore.  Having some trouble with dry foods.  Daughter states that she had a cold before almost 6 weeks and the patient borrowed a compression vest t from another atypical patient (who has MSA) and that seemed to help.  Patient is no longer walking  alone.  Daughter does state that she is much more stable when transferring or walking with assist (no longer alone) and attributes that to the levodopa.  She is having diplopia.  Prisms have helped some.  Patient is attending the atypical support group.  She has done the most form.  She states that she is DNR, but her daughter states that she would want CPR.  Patient's daughter states that they need to update her living well.  PREVIOUS MEDICATIONS: none to date  ALLERGIES:   Allergies  Allergen Reactions  . Iodine Other (See Comments)    Unknown   . Novocain [Procaine] Other (See Comments)    Convulsions   . Neosporin [Neomycin-Bacitracin Zn-Polymyx] Rash    CURRENT MEDICATIONS:  Outpatient Encounter Medications as of 11/03/2018  Medication Sig  . alendronate (FOSAMAX) 70 MG tablet Take 1 tablet (70 mg total) by mouth every 7 (seven) days. Take with a full glass of water on an empty stomach.  . benazepril (LOTENSIN) 40 MG tablet Take 40 mg by mouth daily.   . chlorthalidone (HYGROTON) 25 MG tablet Take 25 mg by mouth daily.  . Cholecalciferol (VITAMIN D3) 2000 UNITS TABS Take 2,000 Units by mouth daily.   . clobetasol cream (TEMOVATE) 0.05 % Apply 1 application topically daily as needed (eczema).   . diclofenac sodium (VOLTAREN) 1 % GEL Apply 2 g topically daily as needed (pain).  Marland Kitchen esomeprazole (NEXIUM) 40 MG capsule Take 40 mg by mouth daily.   . fluticasone (FLONASE) 50 MCG/ACT nasal spray Place 1 spray into both nostrils daily.   . furosemide (LASIX) 20 MG tablet Take 20 mg by mouth daily.  . Liraglutide (VICTOZA) 18 MG/3ML SOPN Inject 1.2 mg into the skin daily.   . metFORMIN (GLUCOPHAGE) 500 MG tablet Take 500 mg by mouth daily with breakfast.  . metoprolol (LOPRESSOR) 50 MG tablet Take 50 mg by mouth 2 (two) times daily.   . Multiple Vitamin (MULTIVITAMIN) tablet Take 1 tablet by mouth daily.  Marland Kitchen oxybutynin (DITROPAN-XL) 5 MG 24 hr tablet Take 5 mg by mouth daily.  . simvastatin  (ZOCOR) 80 MG tablet Take 40 mg by mouth every evening.    No facility-administered encounter medications on file as of 11/03/2018.     PAST MEDICAL HISTORY:   Past Medical History:  Diagnosis Date  . CAD (coronary artery disease)    Non-critical by catheterization on April 27,2006 with normal LV function--last echocardiogram in 2003 which showed normal LVEF--last nuclear stress in August 2010 showed no significant ischemia with an EF of 70%  . Diabetes mellitus (HCC)   . Dizziness   . Hyperlipidemia   . Hypertension   . Obstructive sleep apnea    followed by Dr. Tresa Endo    PAST SURGICAL HISTORY:   Past Surgical  History:  Procedure Laterality Date  . APPENDECTOMY    . breast tumor     benign  . CARDIAC CATHETERIZATION    . CATARACT EXTRACTION, BILATERAL    . CHOLECYSTECTOMY    . FOOT SURGERY    . TUBAL LIGATION      SOCIAL HISTORY:   Social History   Socioeconomic History  . Marital status: Widowed    Spouse name: Not on file  . Number of children: 5  . Years of education: 20   . Highest education level: Not on file  Occupational History  . Occupation: Retired  Engineer, production  . Financial resource strain: Not on file  . Food insecurity:    Worry: Not on file    Inability: Not on file  . Transportation needs:    Medical: Not on file    Non-medical: Not on file  Tobacco Use  . Smoking status: Current Every Day Smoker    Types: E-cigarettes  . Smokeless tobacco: Current User  . Tobacco comment: quit cigarettes in 2014; now vape  Substance and Sexual Activity  . Alcohol use: No    Alcohol/week: 0.0 standard drinks  . Drug use: No  . Sexual activity: Not on file  Lifestyle  . Physical activity:    Days per week: Not on file    Minutes per session: Not on file  . Stress: Not on file  Relationships  . Social connections:    Talks on phone: Not on file    Gets together: Not on file    Attends religious service: Not on file    Active member of club or  organization: Not on file    Attends meetings of clubs or organizations: Not on file    Relationship status: Not on file  . Intimate partner violence:    Fear of current or ex partner: Not on file    Emotionally abused: Not on file    Physically abused: Not on file    Forced sexual activity: Not on file  Other Topics Concern  . Not on file  Social History Narrative   Drinks about a pot of coffee a day, no tea or soda    FAMILY HISTORY:   Family Status  Relation Name Status  . Mother  Deceased at age 47  . Father  Deceased at age 62  . Sister  Deceased  . Brother  Deceased  . Brother  Deceased  . Sister  Deceased  . Child  Deceased  . Child 4 Alive    ROS:  Review of Systems  Constitutional: Negative.   HENT: Negative.   Eyes: Positive for double vision.  Respiratory: Positive for cough.   Cardiovascular: Negative.   Genitourinary: Positive for frequency and urgency.       Urinary incontinence  Skin: Negative.     PHYSICAL EXAMINATION:    VITALS:   Vitals:   11/03/18 1243  BP: 132/86  Pulse: 64  SpO2: 94%    GEN:  The patient appears stated age and is in NAD. HEENT:  Normocephalic, atraumatic.  The mucous membranes are moist. The superficial temporal arteries are without ropiness or tenderness. CV:  RRR Lungs:  CTAB Neck/HEME:  There are no carotid bruits bilaterally.  Neurological examination:  Orientation: The patient is alert and oriented x3. Cranial nerves: There is good facial symmetry.  There is facial hypomimia.  The speech is fluent and clear.  She has trouble with the guttural sounds.  Horizontal gaze is  intact.  She is still able to look up and down, but this is somewhat restricted.  There are a few square wave jerks.  Soft palate rises symmetrically and there is no tongue deviation. Hearing is intact to conversational tone. Sensation: Sensation is intact to light touch throughout Motor: Strength is 5/5 in the bilateral upper and lower extremities.    Shoulder shrug is equal and symmetric.  There is no pronator drift.   Movement examination: Tone: There is mild increased tone in the LUE.   Abnormal movements: none Coordination:  There is significant decremation with RAM's, with any form of RAMS, including alternating supination and pronation of the forearm, hand opening and closing, finger taps, heel taps and toe taps, L much more than right Gait and Station: Did not ambulate the patient today as I really do not want her walking.  She is in a transport chair  Labs: Patient had lab work completed.  Lab work was dated June 04, 2018.  Total cholesterol was 149.  Triglycerides were elevated at 190.  HDL was 45.  White blood cells were 7.8, hemoglobin 11.4, hematocrit 35.5.  Sodium was 139, potassium 4.4, chloride 103, CO2 26, BUN 25, creatinine 1.1, glucose 189, AST 19, ALT 20, alkaline phosphatase 131.  Hemoglobin A1c 7.5.  TSH was 3.06.  Acetylcholine receptor antibody 0.0.  SPEP was negative.  ASSESSMENT/PLAN:  1.  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  We talked about nature, etiology and pathophysiology. We talked about how the symptoms, course and prognosis differ from Parkinson's Disease.  We talked about the risks, particularly for falls and aspiration.   -Reiterated the importance of staying seated at all times.  The patient has already had bilateral subdural hematomas  -order MBE  -Continue carbidopa/levodopa 25/100, 1 tablet 3 times per day  -Talked about end-of-life care.  Talked about putting wishes down on paper.  She apparently has already completed the [place] most form through the atypical support group and I congratulated her on that.  I asked her to get me a copy of that.  I asked her to update her living will.  Her and her daughter were not on the same page regarding living will  wishes.  -Talk to the patient about keeping mentally and physically active, even if she cannot get up out of the wheelchair without assistance.  Talked about what this meant.  Talked about writing her life story.  2.  Much greater than 50% of this visit was spent in counseling and coordinating care.  Total face to face time:  25 min.  Follow up is anticipated in the next 4-5 months, sooner should new neurologic issues arise.   Cc:  Dois Davenportichter, Karen L, MD

## 2018-11-03 ENCOUNTER — Other Ambulatory Visit (HOSPITAL_COMMUNITY): Payer: Self-pay | Admitting: *Deleted

## 2018-11-03 ENCOUNTER — Telehealth: Payer: Self-pay | Admitting: Neurology

## 2018-11-03 ENCOUNTER — Ambulatory Visit: Payer: Medicare Other | Admitting: Neurology

## 2018-11-03 ENCOUNTER — Encounter: Payer: Self-pay | Admitting: Neurology

## 2018-11-03 VITALS — BP 132/86 | HR 64

## 2018-11-03 DIAGNOSIS — G231 Progressive supranuclear ophthalmoplegia [Steele-Richardson-Olszewski]: Secondary | ICD-10-CM

## 2018-11-03 DIAGNOSIS — R1319 Other dysphagia: Secondary | ICD-10-CM

## 2018-11-03 DIAGNOSIS — R1310 Dysphagia, unspecified: Secondary | ICD-10-CM

## 2018-11-03 MED ORDER — CARBIDOPA-LEVODOPA 25-100 MG PO TABS: 1.0000 | ORAL_TABLET | Freq: Three times a day (TID) | ORAL | 1 refills | Status: DC

## 2018-11-03 NOTE — Patient Instructions (Signed)
1. We have scheduled you at [facility] for your modified barium swallow on 11/09/18 at 11:00 am. Please arrive 15 minutes prior and go to 1st floor radiology. If you need to reschedule for any reason please call [phone].

## 2018-11-03 NOTE — Telephone Encounter (Signed)
Mia called and is needing to have patient's appointment moved from 11:00 am to 11:30 am. Please Call to let her know that's okay. Thanks

## 2018-11-03 NOTE — Telephone Encounter (Signed)
Mia made aware patient no longer in our office. Asked her to reach out to patient/family directly.

## 2018-11-05 ENCOUNTER — Other Ambulatory Visit (HOSPITAL_COMMUNITY): Payer: Self-pay | Admitting: *Deleted

## 2018-11-05 DIAGNOSIS — R1310 Dysphagia, unspecified: Secondary | ICD-10-CM

## 2018-11-09 ENCOUNTER — Ambulatory Visit (HOSPITAL_COMMUNITY)
Admission: RE | Admit: 2018-11-09 | Discharge: 2018-11-09 | Disposition: A | Discharge status: Home / Self Care | Payer: Medicare Other | Source: Ambulatory Visit | Attending: Neurology | Admitting: Neurology

## 2018-11-09 ENCOUNTER — Ambulatory Visit (HOSPITAL_COMMUNITY): Payer: Medicare Other

## 2018-11-09 ENCOUNTER — Ambulatory Visit (HOSPITAL_COMMUNITY): Admission: RE | Admit: 2018-11-09 | Payer: Self-pay | Source: Ambulatory Visit

## 2018-11-09 ENCOUNTER — Inpatient Hospital Stay (HOSPITAL_COMMUNITY): Admission: RE | Admit: 2018-11-09 | Payer: Self-pay | Source: Ambulatory Visit

## 2018-11-09 DIAGNOSIS — R1319 Other dysphagia: Secondary | ICD-10-CM

## 2018-12-01 ENCOUNTER — Telehealth: Payer: Self-pay | Admitting: Neurology

## 2018-12-01 DIAGNOSIS — G231 Progressive supranuclear ophthalmoplegia [Steele-Richardson-Olszewski]: Secondary | ICD-10-CM

## 2018-12-01 DIAGNOSIS — R1319 Other dysphagia: Secondary | ICD-10-CM

## 2018-12-01 NOTE — Telephone Encounter (Signed)
Order entered

## 2018-12-01 NOTE — Telephone Encounter (Signed)
Pollyann GlenMary Kate called from [facility] regarding a new order being placed for the Swallow Study so she can link the order. She said it was cancelled and the order was cancelled also. Thanks

## 2018-12-23 ENCOUNTER — Ambulatory Visit (HOSPITAL_COMMUNITY)
Admission: RE | Admit: 2018-12-23 | Discharge: 2018-12-23 | Disposition: A | Discharge status: Home / Self Care | Payer: Medicare Other | Source: Ambulatory Visit | Attending: Neurology | Admitting: Neurology

## 2018-12-23 DIAGNOSIS — R1310 Dysphagia, unspecified: Secondary | ICD-10-CM | POA: Diagnosis present

## 2018-12-23 DIAGNOSIS — R1319 Other dysphagia: Secondary | ICD-10-CM

## 2018-12-23 DIAGNOSIS — G231 Progressive supranuclear ophthalmoplegia [Steele-Richardson-Olszewski]: Secondary | ICD-10-CM

## 2018-12-24 ENCOUNTER — Emergency Department (HOSPITAL_COMMUNITY): Payer: Medicare Other

## 2018-12-24 ENCOUNTER — Encounter (HOSPITAL_COMMUNITY): Payer: Self-pay

## 2018-12-24 ENCOUNTER — Emergency Department (HOSPITAL_COMMUNITY)
Admission: EM | Admit: 2018-12-24 | Discharge: 2018-12-24 | Disposition: A | Discharge status: Home / Self Care | Payer: Medicare Other | Attending: Emergency Medicine | Admitting: Emergency Medicine

## 2018-12-24 DIAGNOSIS — Z7984 Long term (current) use of oral hypoglycemic drugs: Secondary | ICD-10-CM | POA: Diagnosis not present

## 2018-12-24 DIAGNOSIS — I10 Essential (primary) hypertension: Secondary | ICD-10-CM | POA: Diagnosis not present

## 2018-12-24 DIAGNOSIS — R404 Transient alteration of awareness: Secondary | ICD-10-CM | POA: Diagnosis not present

## 2018-12-24 DIAGNOSIS — F17290 Nicotine dependence, other tobacco product, uncomplicated: Secondary | ICD-10-CM | POA: Insufficient documentation

## 2018-12-24 DIAGNOSIS — G20A1 Parkinson's disease without dyskinesia, without mention of fluctuations: Secondary | ICD-10-CM

## 2018-12-24 DIAGNOSIS — Z79899 Other long term (current) drug therapy: Secondary | ICD-10-CM | POA: Diagnosis not present

## 2018-12-24 DIAGNOSIS — I2510 Atherosclerotic heart disease of native coronary artery without angina pectoris: Secondary | ICD-10-CM | POA: Insufficient documentation

## 2018-12-24 DIAGNOSIS — W19XXXA Unspecified fall, initial encounter: Secondary | ICD-10-CM

## 2018-12-24 DIAGNOSIS — E119 Type 2 diabetes mellitus without complications: Secondary | ICD-10-CM | POA: Insufficient documentation

## 2018-12-24 DIAGNOSIS — W1830XA Fall on same level, unspecified, initial encounter: Secondary | ICD-10-CM | POA: Insufficient documentation

## 2018-12-24 DIAGNOSIS — R4182 Altered mental status, unspecified: Secondary | ICD-10-CM | POA: Diagnosis present

## 2018-12-24 DIAGNOSIS — G20 Parkinson's disease: Secondary | ICD-10-CM

## 2018-12-24 LAB — BASIC METABOLIC PANEL
Anion gap: 9 (ref 5–15)
BUN: 47 mg/dL — ABNORMAL HIGH (ref 8–23)
CO2: 22 mmol/L (ref 22–32)
Calcium: 10.6 mg/dL — ABNORMAL HIGH (ref 8.9–10.3)
Chloride: 107 mmol/L (ref 98–111)
Creatinine, Ser: 1.17 mg/dL — ABNORMAL HIGH (ref 0.44–1.00)
GFR calc Af Amer: 50 mL/min — ABNORMAL LOW (ref >60)
GFR calc non Af Amer: 43 mL/min — ABNORMAL LOW (ref >60)
Glucose, Bld: 176 mg/dL — ABNORMAL HIGH (ref 70–99)
Potassium: 3.8 mmol/L (ref 3.5–5.1)
Sodium: 138 mmol/L (ref 135–145)

## 2018-12-24 LAB — CBC
HCT: 37.4 % (ref 36.0–46.0)
Hemoglobin: 11.6 g/dL — ABNORMAL LOW (ref 12.0–15.0)
MCH: 28.4 pg (ref 26.0–34.0)
MCHC: 31 g/dL (ref 30.0–36.0)
MCV: 91.7 fL (ref 80.0–100.0)
Platelets: 261 10*3/uL (ref 150–400)
RBC: 4.08 MIL/uL (ref 3.87–5.11)
RDW: 14.5 % (ref 11.5–15.5)
WBC: 7.4 10*3/uL (ref 4.0–10.5)
nRBC: 0 % (ref 0.0–0.2)

## 2018-12-24 MED ORDER — IOPAMIDOL (ISOVUE-370) INJECTION 76%: 100.0000 mL | Freq: Once | INTRAVENOUS | Status: DC | PRN

## 2018-12-24 NOTE — ED Provider Notes (Signed)
[facility] Provider Note   CSN: [id number] Arrival date & time: 12/24/18  [id number]     History   Chief Complaint Chief Complaint  Patient presents with  . Fall  . Altered Mental Status    HPI Angela Craig is a 85 y.o. female.  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  She has history of falling backwards spontaneously if she is in a standing position and moves her head the wrong direction.  She has had multiple falls and injuries due to this.  Today patient reports that she got out of bed by herself to get some nail clippers.  Her daughter reports she has never supposed to be getting out of bed without assistance.  Patient got to her dresser and fell.  Her daughter heard the fall but was not in the room.  She came into check on her and found her lying on her back on the floor.  She reports that she was just staring up at the ceiling and for a few minutes did not respond to her.  She reports her eyes were open but she was not responding.  Now she is back to baseline.  Patient reports she does have somewhat of a posterior headache.  She denies any blurred vision or double vision.  She denies neck pain but on exam endorses some bony tenderness.  Patient reports she has pain in her low back.  She denies numbness or tingling to her legs or feet.  She does however have a chronic peripheral neuropathy.  Patient reports she feels some numbness in her left arm.  She does not have any dysfunction however. Past Medical History:  Diagnosis Date  . CAD (coronary artery disease)    Non-critical by catheterization on April 27,2006 with normal LV function--last echocardiogram in 2003 which showed normal LVEF--last nuclear stress in August 2010 showed no significant ischemia with an EF of 70%  . Diabetes mellitus (HCC)   . Dizziness   . Hyperlipidemia   . Hypertension   . Obstructive sleep apnea    followed by Dr. Tresa EndoKelly     Patient Active Problem List   Diagnosis Date Noted  . PSP (progressive supranuclear palsy) (HCC) 11/03/2018  . Subdural hematoma (HCC) 07/12/2018  . Pain 09/27/2017    Past Surgical History:  Procedure Laterality Date  . APPENDECTOMY    . breast tumor     benign  . CARDIAC CATHETERIZATION    . CATARACT EXTRACTION, BILATERAL    . CHOLECYSTECTOMY    . FOOT SURGERY    . TUBAL LIGATION       OB History   No obstetric history on file.      Home Medications    Prior to Admission medications   Medication Sig Start Date End Date Taking? Authorizing Provider  alendronate (FOSAMAX) 70 MG tablet Take 1 tablet (70 mg total) by mouth every 7 (seven) days. Take with a full glass of water on an empty stomach. Patient taking differently: Take 70 mg by mouth every Friday. Take with a full glass of water on an empty stomach. 07/13/18 07/13/19 Yes [place], Carley D, DO  carbidopa-levodopa (SINEMET IR) 25-100 MG tablet Take 1 tablet by mouth 3 (three) times daily. 11/03/18  Yes Tat, Octaviano Battyebecca S, DO  chlorthalidone (HYGROTON) 25 MG tablet Take 25 mg by mouth daily.   Yes [provider]  Cholecalciferol (VITAMIN D3) 2000 UNITS TABS Take 2,000 Units by mouth daily.  Yes [provider]  clobetasol cream (TEMOVATE) 0.05 % Apply 1 application topically daily as needed (eczema).  06/23/17  Yes [provider]  diclofenac sodium (VOLTAREN) 1 % GEL Apply 2 g topically daily as needed (pain).   Yes [provider]  esomeprazole (NEXIUM) 40 MG capsule Take 40 mg by mouth daily.    Yes [provider]  fluticasone (FLONASE) 50 MCG/ACT nasal spray Place 1 spray into both nostrils daily as needed for allergies.    Yes [provider]  furosemide (LASIX) 20 MG tablet Take 20 mg by mouth daily.   Yes [provider]  Liraglutide (VICTOZA) 18 MG/3ML SOPN Inject 1.2 mg into the skin every morning.    Yes [provider]  losartan  (COZAAR) 100 MG tablet Take 100 mg by mouth daily. 10/23/18  Yes [provider]  metFORMIN (GLUCOPHAGE) 500 MG tablet Take 500 mg by mouth daily with breakfast.   Yes [provider]  metoprolol (LOPRESSOR) 50 MG tablet Take 50 mg by mouth 2 (two) times daily.    Yes [provider]  Multiple Vitamin (MULTIVITAMIN) tablet Take 1 tablet by mouth daily.   Yes [provider]  oxybutynin (DITROPAN-XL) 5 MG 24 hr tablet Take 5 mg by mouth daily. 06/08/18  Yes [provider]  simvastatin (ZOCOR) 40 MG tablet Take 40 mg by mouth at bedtime.    Yes [provider]    Family History Family History  Problem Relation Age of Onset  . Diabetes Mother   . Heart attack Mother   . Heart disease Father   . Breast cancer Sister   . Cancer Brother   . Diabetes Brother   . Diabetes Sister   . Suicidality Child     Social History Social History   Tobacco Use  . Smoking status: Current Every Day Smoker    Types: E-cigarettes  . Smokeless tobacco: Current User  . Tobacco comment: quit cigarettes in 2014; now vape  Substance Use Topics  . Alcohol use: No    Alcohol/week: 0.0 standard drinks  . Drug use: No     Allergies   Iodine; Novocain [procaine]; and Neosporin [neomycin-bacitracin zn-polymyx]   Review of Systems Review of Systems 10 Systems reviewed and are negative for acute change except as noted in the HPI.   Physical Exam Updated Vital Signs BP 125/70   Pulse (!) 56   Temp 98.1 F (36.7 C) (Oral)   Resp (!) 21   SpO2 96%   Physical Exam Constitutional:      Comments: Patient is alert in no acute distress.  No respiratory distress.  Color good.  Answering questions appropriately.  HENT:     Head: Normocephalic and atraumatic.     Nose: Nose normal.     Mouth/Throat:     Mouth: Mucous membranes are moist.  Eyes:     Extraocular Movements: Extraocular movements intact.  Neck:     Comments: Patient endorses some  tenderness to palpation on the bony prominence of the cervical spine.  Lower C4-C5.  Neck is supple. Cardiovascular:     Rate and Rhythm: Normal rate and regular rhythm.  Pulmonary:     Effort: Pulmonary effort is normal.     Breath sounds: Normal breath sounds.  Chest:     Chest wall: No tenderness.  Abdominal:     General: There is no distension.     Palpations: Abdomen is soft.     Tenderness: There  is no abdominal tenderness. There is no guarding.  Musculoskeletal:     Comments: Normal range of motion of extremities.  No deformities contusions or abrasions.  Pedal pulses 2+ and symmetric.  Skin:    General: Skin is warm and dry.  Neurological:     Comments: Patient is alert.  She is answering questions appropriately.  Upper extremity strength 5\5 grip strength.  Patient does not endorse differential to light touch left or right of the upper extremity.  Cranial nerves are intact.  Patient can elevate each lower extremity off of the bed and hold against resistance.  Patient has chronic peripheral neuropathy and reports slight decreased sensation bilaterally and symmetrically on the feet.  Psychiatric:        Mood and Affect: Mood normal.      ED Treatments / Results  Labs (all labs ordered are listed, but only abnormal results are displayed) Labs Reviewed  BASIC METABOLIC PANEL - Abnormal; Notable for the following components:      Result Value   Glucose, Bld 176 (*)    BUN 47 (*)    Creatinine, Ser 1.17 (*)    Calcium 10.6 (*)    GFR calc non Af Amer 43 (*)    GFR calc Af Amer 50 (*)    All other components within normal limits  CBC - Abnormal; Notable for the following components:   Hemoglobin 11.6 (*)    All other components within normal limits    EKG EKG Interpretation  Date/Time:  Thursday December 24 2018 10:17:44 EST Ventricular Rate:  60 PR Interval:    QRS Duration: 99 QT Interval:  418 QTC Calculation: 418 R Axis:   56 Text Interpretation:  Sinus rhythm  Short PR interval normal , no change from previous Confirmed by Arby BarrettePfeiffer, Aneli Zara [phone]([id number]) on 12/24/2018 10:29:34 AM   Radiology Dg Lumbar Spine 2-3 Views  Result Date: 12/24/2018 CLINICAL DATA:  Fall, low back pain EXAM: LUMBAR SPINE - 2-3 VIEW COMPARISON:  None. FINDINGS: Advanced degenerative facet disease in the mid and lower lumbar spine. 8 mm of anterolisthesis of L4 on L5 related to facet disease. Mild degenerative disc disease diffusely, most notable at L1-2 and L2-3. No fracture. SI joints symmetric and unremarkable. Oral contrast material noted throughout the colon including in left side diverticula. IMPRESSION: Degenerative disc and facet disease. Grade 1 anterolisthesis at L4-5. No acute bony abnormality. Electronically Signed   By: Charlett NoseKevin  Dover M.D.   On: 12/24/2018 11:27   Dg Pelvis 1-2 Views  Result Date: 12/24/2018 CLINICAL DATA:  Fall, altered mental status EXAM: PELVIS - 1-2 VIEW COMPARISON:  None FINDINGS: Diffuse osseous demineralization. Narrowing of the hip joints bilaterally greater on LEFT. SI joints appear preserved. No definite fracture, dislocation, or bone destruction. Retained contrast within numerous colonic diverticula and colon. Increased stool in rectum. IMPRESSION: No acute osseous abnormalities. Osseous demineralization with mild degenerative changes of the hip joints. Distal colonic diverticulosis. Electronically Signed   By: Ulyses SouthwardMark  Boles M.D.   On: 12/24/2018 11:27   Ct Head Wo Contrast  Result Date: 12/24/2018 CLINICAL DATA:  Trauma. EXAM: CT HEAD WITHOUT CONTRAST CT CERVICAL SPINE WITHOUT CONTRAST TECHNIQUE: Multidetector CT imaging of the head and cervical spine was performed following the standard protocol without intravenous contrast. Multiplanar CT image reconstructions of the cervical spine were also generated. COMPARISON:  CT scan of July 12, 2018. FINDINGS: CT HEAD FINDINGS Brain: Mild diffuse cortical atrophy is noted. Mild chronic ischemic white matter disease is  noted. Right-sided subdural hematoma noted on prior exam has nearly resolved, with only small amount of residual density seen in this area. No mass effect or midline shift is noted. Ventricular size is within normal limits. No acute infarction or mass lesion is noted. Vascular: No hyperdense vessel or unexpected calcification. Skull: Normal. Negative for fracture or focal lesion. Sinuses/Orbits: No acute finding. Other: None. CT CERVICAL SPINE FINDINGS Alignment: Normal. Skull base and vertebrae: No acute fracture. No primary bone lesion or focal pathologic process. Soft tissues and spinal canal: No prevertebral fluid or swelling. No visible canal hematoma. Disc levels: Mild degenerative disc disease is noted at C3-4, C4-5 and C5-6. Upper chest: Negative. Other: Mild degenerative changes are seen involving posterior facet joints bilaterally. IMPRESSION: Mild diffuse cortical atrophy. Mild chronic ischemic white matter disease. Right-sided subdural hematoma noted on prior exam has nearly resolved. No acute intracranial abnormality is noted. Mild multilevel degenerative disc disease. No acute abnormality seen in the cervical spine. Electronically Signed   By: Lupita Raider, M.D.   On: 12/24/2018 12:55   Ct Cervical Spine Wo Contrast  Result Date: 12/24/2018 CLINICAL DATA:  Trauma. EXAM: CT HEAD WITHOUT CONTRAST CT CERVICAL SPINE WITHOUT CONTRAST TECHNIQUE: Multidetector CT imaging of the head and cervical spine was performed following the standard protocol without intravenous contrast. Multiplanar CT image reconstructions of the cervical spine were also generated. COMPARISON:  CT scan of July 12, 2018. FINDINGS: CT HEAD FINDINGS Brain: Mild diffuse cortical atrophy is noted. Mild chronic ischemic white matter disease is noted. Right-sided subdural hematoma noted on prior exam has nearly resolved, with only small amount of residual density seen in this area. No mass effect or midline shift is noted. Ventricular  size is within normal limits. No acute infarction or mass lesion is noted. Vascular: No hyperdense vessel or unexpected calcification. Skull: Normal. Negative for fracture or focal lesion. Sinuses/Orbits: No acute finding. Other: None. CT CERVICAL SPINE FINDINGS Alignment: Normal. Skull base and vertebrae: No acute fracture. No primary bone lesion or focal pathologic process. Soft tissues and spinal canal: No prevertebral fluid or swelling. No visible canal hematoma. Disc levels: Mild degenerative disc disease is noted at C3-4, C4-5 and C5-6. Upper chest: Negative. Other: Mild degenerative changes are seen involving posterior facet joints bilaterally. IMPRESSION: Mild diffuse cortical atrophy. Mild chronic ischemic white matter disease. Right-sided subdural hematoma noted on prior exam has nearly resolved. No acute intracranial abnormality is noted. Mild multilevel degenerative disc disease. No acute abnormality seen in the cervical spine. Electronically Signed   By: Lupita Raider, M.D.   On: 12/24/2018 12:55   Dg Op Swallowing Func-medicare/speech Path  Result Date: 12/23/2018 Objective Swallowing Evaluation: Type of Study: MBS-Modified Barium Swallow Study  Patient Details Name: DORISANN SCHWANKE MRN: [id number] Date of Birth: [birth year] Today's Date: 12/23/2018 Time: SLP Start Time (ACUTE ONLY): 1135 -SLP Stop Time (ACUTE ONLY): 1150 SLP Time Calculation (min) (ACUTE ONLY): 15 min Past Medical History: Past Medical History: Diagnosis Date . CAD (coronary artery disease)   Non-critical by catheterization on April 27,2006 with normal LV function--last echocardiogram in 2003 which showed normal LVEF--last nuclear stress in August 2010 showed no significant ischemia with an EF of 70% . Diabetes mellitus (HCC)  . Dizziness  . Hyperlipidemia  . Hypertension  . Obstructive sleep apnea   followed by Dr. Tresa Endo Past Surgical History: Past Surgical History: Procedure Laterality Date . APPENDECTOMY   . breast tumor    benign  . CARDIAC CATHETERIZATION   .  CATARACT EXTRACTION, BILATERAL   . CHOLECYSTECTOMY   . FOOT SURGERY   . TUBAL LIGATION   HPI: Pt is an 85 y.o. female, a pt of Dr. Don Perking, and referred to establish a baseline of swallow function per pt's son.  She has occasional difficulty swallowing dry foods. Ms. Kindig has a dx of one of the atypical parkinsonian states, and likely Progressive Supranuclear Palsy (PSP).  She presents with hypomimia, fluent and clear speech.  Cranial nerves are generally WFL.  July 2019 admission for bilateral SDHs after mechanical fall. She has had continued falls. Now on Levodopa.   Subjective: alert, good historian Assessment / Plan / Recommendation CHL IP CLINICAL IMPRESSIONS 12/23/2018 Clinical Impression Pt presents with quite functional oropharyngeal swallow with adequate mastication and bolus propulsion; timely swallow trigger; no residue in pharynx post-swallow; no penetration/nor aspiration noted with any tested consistencies.  A 13 mm barium pill passed easily through UES and esophagus.  There was a notable CP bar, which occasionally collected trace residue but did not impact function.  Pt and son viewed the video in real time and discussed results.  Continue regular diet/ thin liquids, meds whole with thin liquids.  No SLP f/u is needed.  SLP Visit Diagnosis Dysphagia, unspecified (R13.10) Attention and concentration deficit following -- Frontal lobe and executive function deficit following -- Impact on safety and function No limitations   CHL IP TREATMENT RECOMMENDATION 12/23/2018 Treatment Recommendations No treatment recommended at this time   No flowsheet data found. CHL IP DIET RECOMMENDATION 12/23/2018 SLP Diet Recommendations Regular solids;Thin liquid Liquid Administration via Cup Medication Administration Whole meds with liquid Compensations -- Postural Changes --   CHL IP OTHER RECOMMENDATIONS 12/23/2018 Recommended Consults -- Oral Care Recommendations Oral care BID Other Recommendations  --   CHL IP FOLLOW UP RECOMMENDATIONS 12/23/2018 Follow up Recommendations None   No flowsheet data found.     CHL IP ORAL PHASE 12/23/2018 Oral Phase WFL Oral - Pudding Teaspoon -- Oral - Pudding Cup -- Oral - Honey Teaspoon -- Oral - Honey Cup -- Oral - Nectar Teaspoon -- Oral - Nectar Cup -- Oral - Nectar Straw -- Oral - Thin Teaspoon -- Oral - Thin Cup -- Oral - Thin Straw -- Oral - Puree -- Oral - Mech Soft -- Oral - Regular -- Oral - Multi-Consistency -- Oral - Pill -- Oral Phase - Comment --  CHL IP PHARYNGEAL PHASE 12/23/2018 Pharyngeal Phase WFL Pharyngeal- Pudding Teaspoon -- Pharyngeal -- Pharyngeal- Pudding Cup -- Pharyngeal -- Pharyngeal- Honey Teaspoon -- Pharyngeal -- Pharyngeal- Honey Cup -- Pharyngeal -- Pharyngeal- Nectar Teaspoon -- Pharyngeal -- Pharyngeal- Nectar Cup -- Pharyngeal -- Pharyngeal- Nectar Straw -- Pharyngeal -- Pharyngeal- Thin Teaspoon -- Pharyngeal -- Pharyngeal- Thin Cup -- Pharyngeal -- Pharyngeal- Thin Straw -- Pharyngeal -- Pharyngeal- Puree -- Pharyngeal -- Pharyngeal- Mechanical Soft -- Pharyngeal -- Pharyngeal- Regular -- Pharyngeal -- Pharyngeal- Multi-consistency -- Pharyngeal -- Pharyngeal- Pill -- Pharyngeal -- Pharyngeal Comment --  CHL IP CERVICAL ESOPHAGEAL PHASE 12/23/2018 Cervical Esophageal Phase (No Data) Pudding Teaspoon -- Pudding Cup -- Honey Teaspoon -- Honey Cup -- Nectar Teaspoon -- Nectar Cup -- Nectar Straw -- Thin Teaspoon -- Thin Cup -- Thin Straw -- Puree -- Mechanical Soft -- Regular -- Multi-consistency -- Pill -- Cervical Esophageal Comment -- Blenda Mounts Laurice 12/23/2018, 1:37 PM            CLINICAL DATA:  Dysphagia with dry foods. Coughing when eating. Progressive supranuclear palsy. EXAM: MODIFIED BARIUM SWALLOW TECHNIQUE: Different  consistencies of barium were administered orally to the patient by the Speech Pathologist. Imaging of the pharynx was performed in the lateral projection. FLUOROSCOPY TIME:  Fluoroscopy Time:  54 seconds. Radiation  Exposure Index (if provided by the fluoroscopic device): NA Number of Acquired Spot Images: 0 COMPARISON:  Chest CT 08/17/2017. FINDINGS: Thin liquid-within normal limits.  No penetration or aspiration. Nectar thick liquid-not administered Honey-not administered Pure-within normal limits.  No penetration or aspiration. Pure with cracker-within normal limits. No penetration or aspiration. Barium tablet-swallowed without difficulty and passed into the thoracic esophagus. IMPRESSION: No significant findings identified. No laryngeal penetration or aspiration. Please refer to the Speech Pathologists report for complete details and recommendations. Electronically Signed   By: Carey Bullocks M.D.   On: 12/23/2018 12:20    Procedures Procedures (including critical care time)  Medications Ordered in ED Medications  iopamidol (ISOVUE-370) 76 % injection 100 mL (has no administration in time range)     Initial Impression / Assessment and Plan / ED Course  I have reviewed the triage vital signs and the nursing notes.  Pertinent labs & imaging results that were available during my care of the patient were reviewed by me and considered in my medical decision making (see chart for details).     Patient is not supposed to get up without assistance.  She however does.  She fell backwards after trying to get something from her dresser.  This reportedly happens frequently due to her form of Parkinson's.  Patient is back to normal mental status.  Diagnostic work-up shows no new intracranial injury or cervical fracture.  Patient has some low back pain but no dysfunction.  The pain was reproducible low on the sacrum.  Not suggestive of spine fracture.  Patient is discharged in stable condition at baseline.  Return precautions reviewed.  Final Clinical Impressions(s) / ED Diagnoses   Final diagnoses:  Fall, initial encounter  Transient alteration of awareness  Parkinson disease [facility])    ED Discharge Orders     None       Arby Barrette, MD 12/24/18 1351

## 2018-12-24 NOTE — ED Triage Notes (Signed)
Pt presents via [facility] for evaluation of fall and altered mental status this AM. Pt lives at home with daughter. Dementia at baseline but typically is interactive and AxO x3. Family reports pt responsiveness slowed this AM and not as interactive as normal. Pt got up to use bathroom this morning and fell. Pt initially reported sacral back pain on EMS arrival.

## 2018-12-24 NOTE — ED Notes (Signed)
Patient verbalizes understanding of discharge instructions. Opportunity for questioning and answers were provided. Armband removed by staff, pt discharged from ED. Pt changed into brief and paper scrubs. Pt with daughter waiting for clothing and ride. Pt/family given coffee.

## 2018-12-24 NOTE — Discharge Instructions (Signed)
1.  Continue to observe for any changes from your normal baseline activity and function. 2.  Follow-up with your family doctor in 2 to 3 days. 3.  Return to the emergency department if there are changes from normal baseline activity and mental function.

## 2019-03-24 NOTE — Progress Notes (Signed)
Pt was to be seen for video visit.  MA contacted daughter just prior to visit and daughter stated that she forgot about visit and would need to get ready for it.  She was able to review history, etc for [place].  I sent "invite" for the video and no response to that.  I called daughter (the number I was told to call) and got a VM.  I left a message on that VM.  I sent video invite again and no response.  Daughter called 15 min after visit and stated that she could not get into video visit.  Was told to r/s.  If had picked up phone, would have done phone visit or used another platform but will r/s now as another patient waiting to be seen via telemedicine.

## 2019-03-25 ENCOUNTER — Encounter: Payer: Self-pay | Admitting: Neurology

## 2019-03-25 ENCOUNTER — Other Ambulatory Visit: Payer: Self-pay

## 2019-03-25 ENCOUNTER — Telehealth (INDEPENDENT_AMBULATORY_CARE_PROVIDER_SITE_OTHER): Payer: Medicare Other | Admitting: Neurology

## 2019-03-28 ENCOUNTER — Other Ambulatory Visit: Payer: Self-pay | Admitting: Neurology

## 2019-03-29 NOTE — Progress Notes (Signed)
Virtual Visit via Phone Note (attempted video but daughter unable to get connection to work) The purpose of this virtual visit is to provide medical care while limiting exposure to the novel coronavirus.    Consent was obtained for phone visit:  Yes.   Answered questions that patient had about telehealth interaction:  Yes.   I discussed the limitations, risks, security and privacy concerns of performing an evaluation and management service by phone. I also discussed with the patient that there may be a patient responsible charge related to this service. The patient expressed understanding and agreed to proceed.  Pt location: Home Physician Location: office Name of referring provider:  Dois Davenport, Craig I connected with Angela Craig at patients initiation/request on 03/31/2019 at  9:00 AM EDT by phone and verified that I am speaking with the correct person using two identifiers. Pt MRN:  [id number] Pt DOB:  22-Jun-1933 Video Participants:  Angela Craig;  Daughter, Angela Craig   History of Present Illness:  Patient is seen today in follow-up for PSP.  She is on carbidopa/levodopa 25/100, 1 tablet 3 times per day.  Last visit, we discussed the importance of staying seated at all times and not walking at all.  Records are reviewed since last visit.  She was in the emergency room on December 24, 2018 after a fall and then had altered mental status the following day.  CT brain was nonacute.  She was discharged to home following the evaluation. She states that she doesn't remember the fall.  Daughter states that she got up to get a nail file and fell backward and hit her head.   She is staying seated and in the wheelchair most times now unless someone was with her.   Daughter does asked me what can help her with her magnetic gait when she first get up when they are trying to transfer her.  They have a trapeze over her, and while that helps her get up, the initiation of gait is an issue. She had a  modified barium swallow on December 23, 2018.  This was essentially unremarkable.  Last visit, we discussed advance care directives.  She reports that she has a living will and has a HCPOA and has her MOST form done and has her DNR.  Pt lives with daughter and there is usually someone there.  Her other daughter helps with showers.  Daughter reports "chest congestion" since Jan.  PCP has given mucinex and it helped.  They called PCP again yesterday.   Observations/Objective:   There were no vitals filed for this visit. Unable - had to be converted to phone visit   Assessment and Plan:   1.  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  We talked about nature, etiology and pathophysiology. We talked about how the symptoms, course and prognosis differ from Parkinson's Disease.  We talked about the risks, particularly for falls and aspiration.              -Talked once again about the importance of staying seated at all times. The patient has already had bilateral subdural hematomas.  She had another recent fall with hitting her head and mental status change.  Explained to her that she should not be getting up unless somebody is directly with her and  holding her.  Her daughter asked about her magnetic gait and I explained that there is really not a lot that can help her.  We talked about various tricks that can help to reinitiate gait, but overall asked them to keep her seated as much as they can.             -Continue carbidopa/levodopa 25/100, 1 tablet 3 times per day             -Modified barium swallow on December 23, 2018 was unremarkable.             -Talked about end-of-life care.    I thanked the patient for updating her advance care directives, including her DNR.             -Discussed in detail Covid-19 and risk factors for this, including age and PSP.  Discussed  importance of social distancing.  Discussed importance of staying home at all times, as is feasible.  Discussed taking advantage of grocery store hours for the elderly.  Pt expressed understanding. 2.  Cough and congestion  -Told him to follow-up with primary care.  They did call primary care yesterday and are awaiting a call back.  Follow Up Instructions:    -I discussed the assessment and treatment plan with the patient. The patient was provided an opportunity to ask questions and all were answered. The patient agreed with the plan and demonstrated an understanding of the instructions.   The patient was advised to call back or seek an in-person evaluation if the symptoms worsen or if the condition fails to improve as anticipated.    Total Time spent in visit with the patient was:  13 min, of which more than 50% of the time was spent in counseling and/or coordinating care on safety.   Pt understands and agrees with the plan of care outlined.     Kerin Salenebecca Tat, DO

## 2019-03-31 ENCOUNTER — Encounter: Payer: Self-pay | Admitting: *Deleted

## 2019-03-31 ENCOUNTER — Telehealth (INDEPENDENT_AMBULATORY_CARE_PROVIDER_SITE_OTHER): Payer: Medicare Other | Admitting: Neurology

## 2019-03-31 ENCOUNTER — Other Ambulatory Visit: Payer: Self-pay

## 2019-03-31 DIAGNOSIS — G231 Progressive supranuclear ophthalmoplegia [Steele-Richardson-Olszewski]: Secondary | ICD-10-CM | POA: Diagnosis not present

## 2019-04-06 ENCOUNTER — Ambulatory Visit: Payer: Self-pay | Admitting: Neurology

## 2019-08-02 ENCOUNTER — Other Ambulatory Visit: Payer: Self-pay | Admitting: Neurology

## 2019-08-02 NOTE — Telephone Encounter (Signed)
Requested Prescriptions   Pending Prescriptions Disp Refills  . carbidopa-levodopa (SINEMET IR) 25-100 MG tablet [Pharmacy Med Name: CARB LEVODOPA  25MG  100MG   TAB] 270 tablet 3    Sig: TAKE 1 TABLET BY MOUTH 3  TIMES DAILY   Rx last filled: 03/29/19 #270 0 refills  Pt last seen: 03/31/19   Follow up appt scheduled: 09/30/19

## 2019-08-30 IMAGING — CR DG RIBS W/ CHEST 3+V*L*
3 series · 3 of 3 positions shown · non-contrast
Comparison: 07/15/2009 chest radiograph

CLINICAL DATA: 84 y/o F; fall with injury to the left lateral and
upper posterior ribs.

EXAM:
LEFT RIBS AND CHEST - 3+ VIEW

[w chest pa]
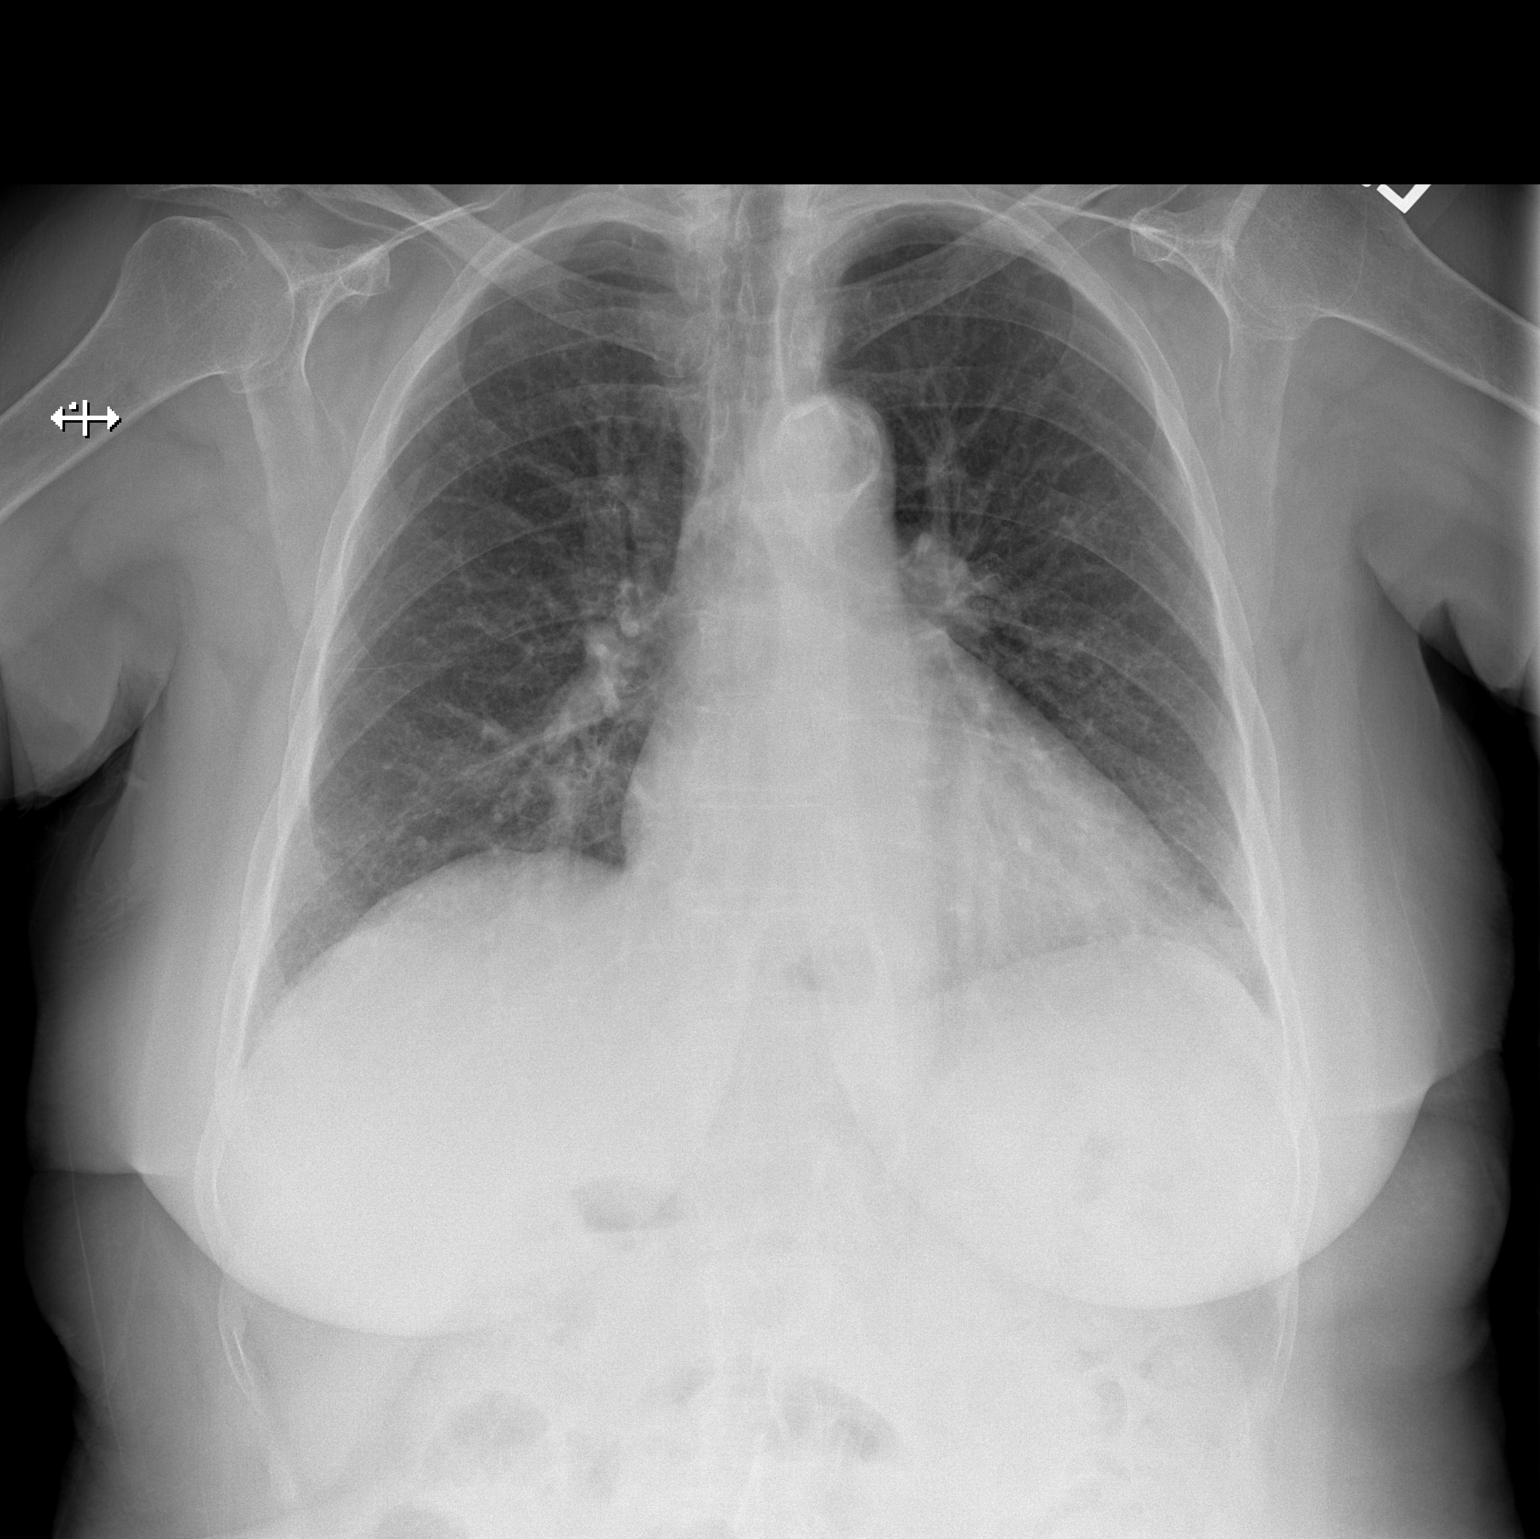

[w ribs ap upper left]
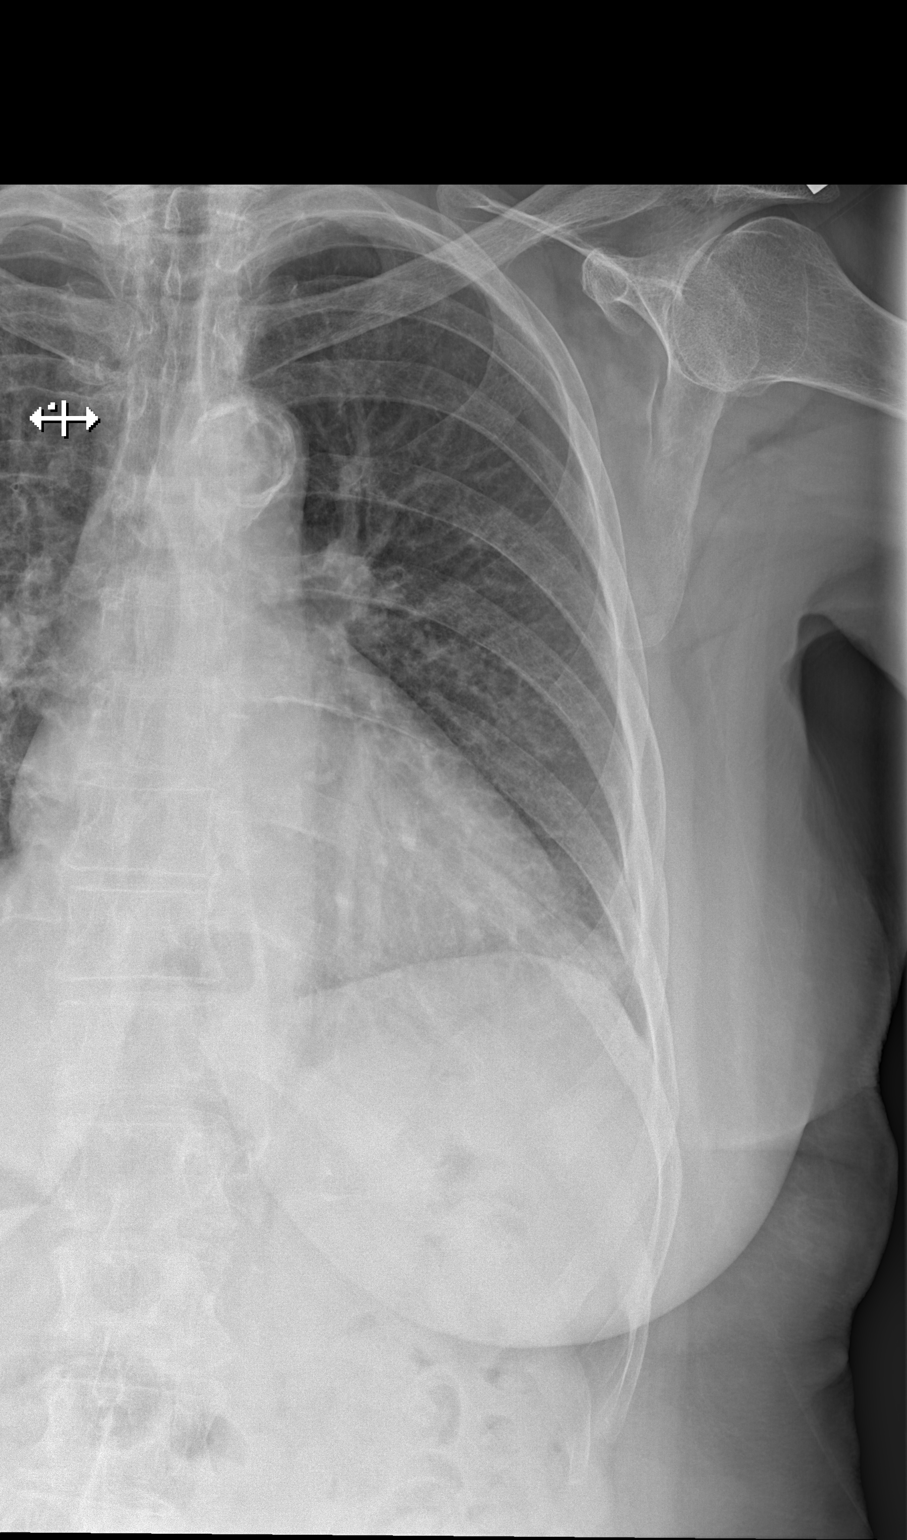

[w ribs obl left]
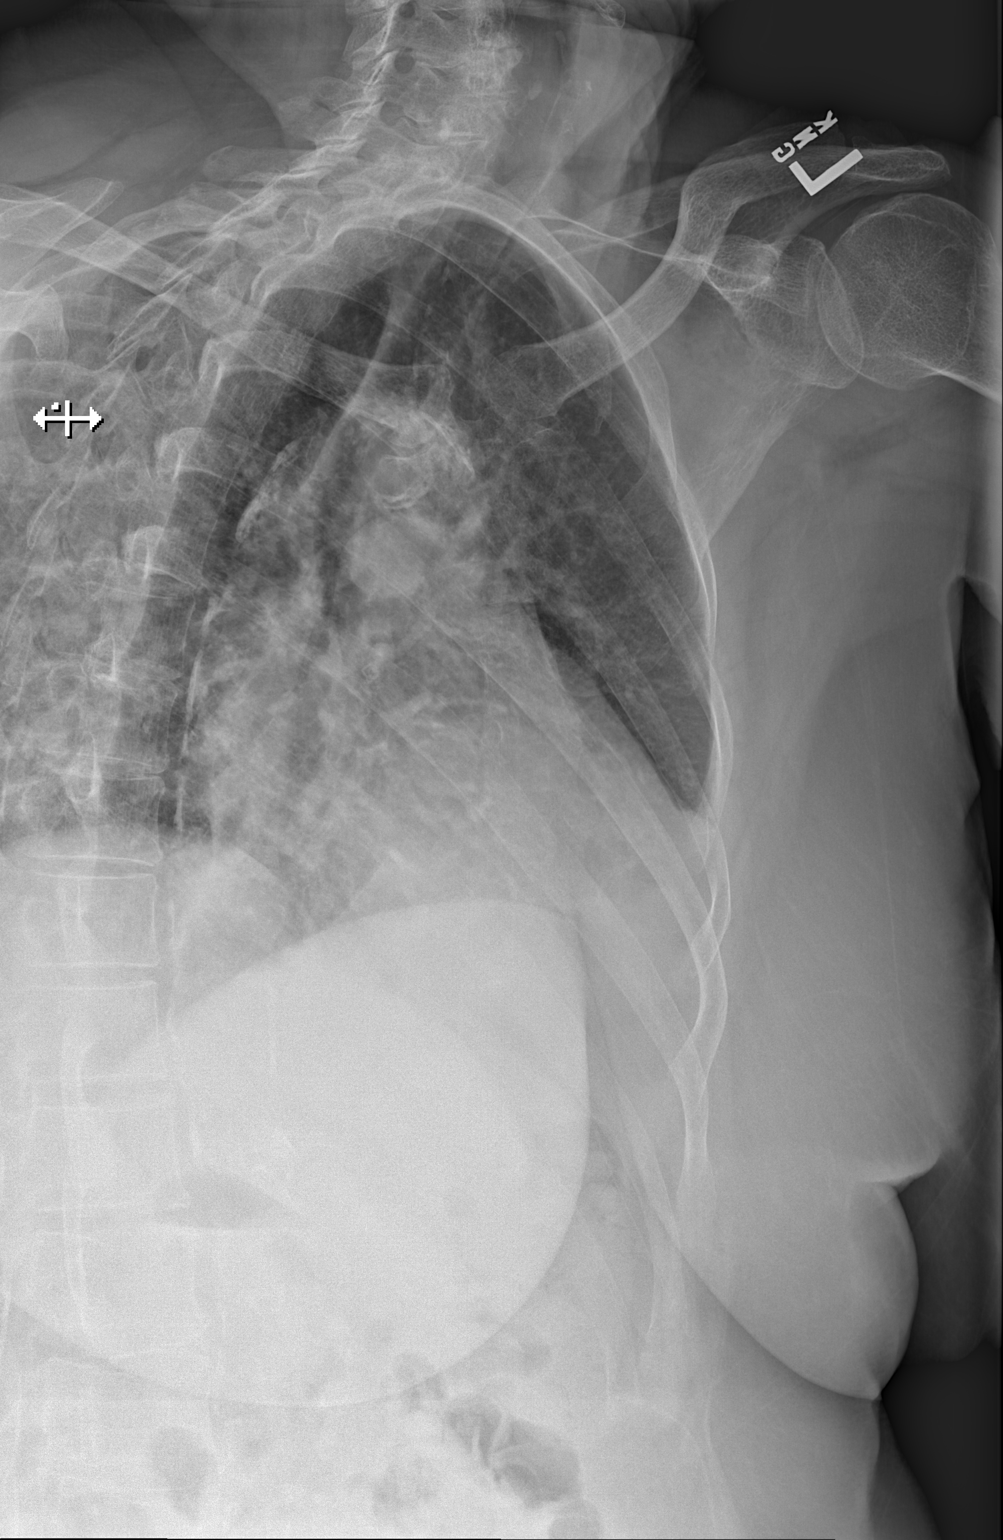

[3 of 3 positions shown; findings below may reference images not displayed]

FINDINGS: Stable normal cardiac silhouette. Aortic atherosclerosis with
calcification. Clear lungs. No pneumothorax or effusion.

Mildly displaced left seventh and eighth lateral rib fractures.
IMPRESSION: Mildly displaced left seventh and eighth lateral rib fractures. No
pneumothorax.

By: Eurio Jossimar M.D.

## 2019-08-30 IMAGING — CT CT HEAD W/O CM
3 of 4 series · 15 of 47 positions shown, 18 images · non-contrast
Comparison: MRI brain 10/24/2015.

CLINICAL DATA: 84-year-old who fell out of bed and struck a side
table this morning. Initial encounter.

EXAM:
CT HEAD WITHOUT CONTRAST
TECHNIQUE: Contiguous axial images were obtained from the base of the skull
through the vertex without intravenous contrast.

[Series 2: head w/o · axial · non-contrast · 0.45mm/px · z∈[-172,-52]mm · 9 of 31 slices shown, 12 images]
[im 4/31  brain]
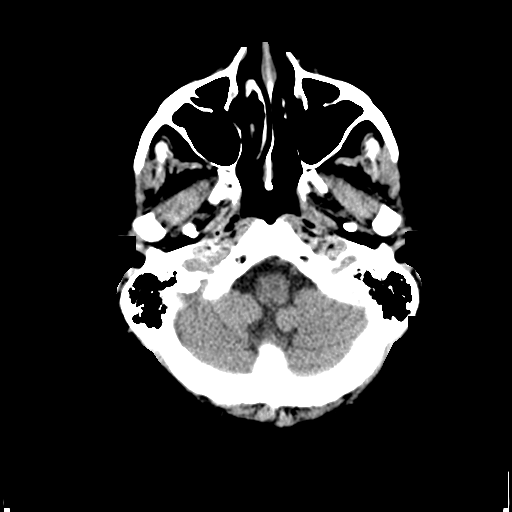
[im 4/31  bone]
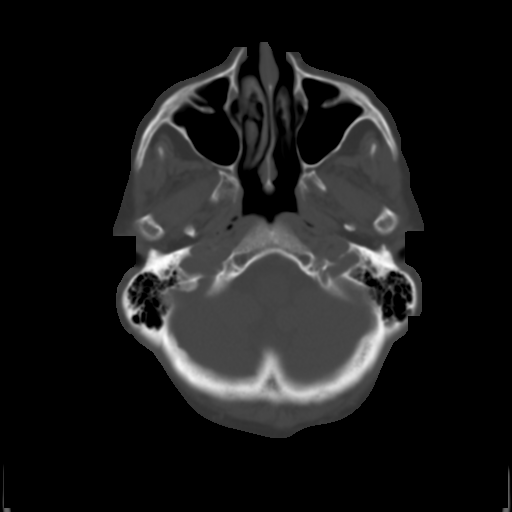
[im 7/31  brain]
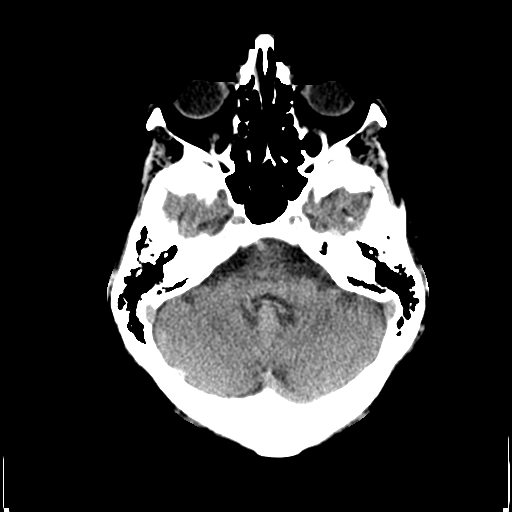
[im 10/31  brain]
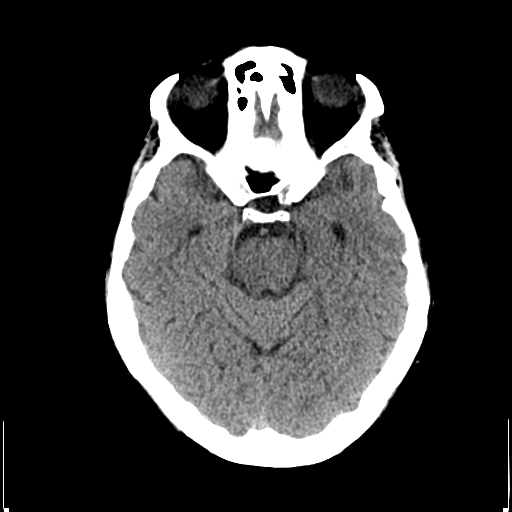
[im 13/31  brain]
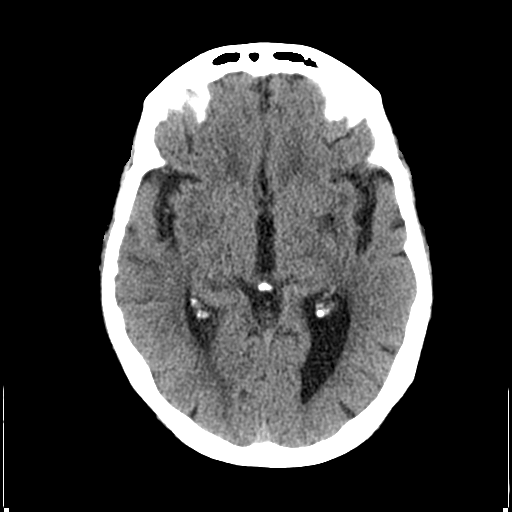
[im 16/31  brain]
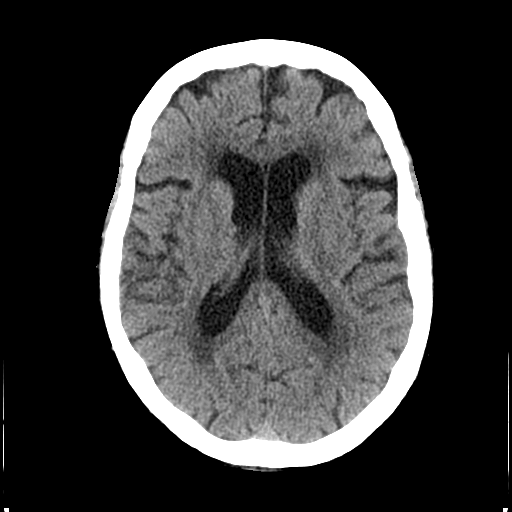
[im 16/31  bone]
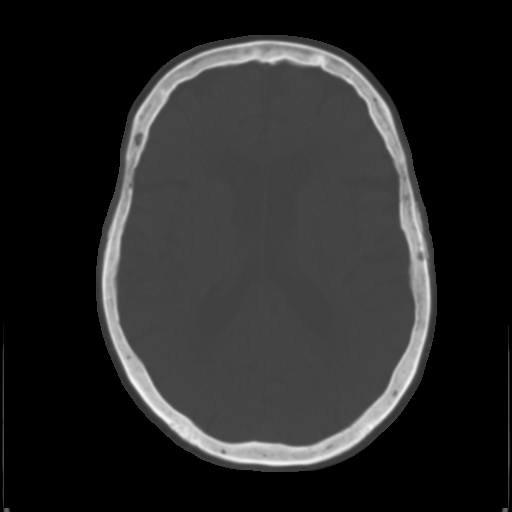
[im 19/31  brain]
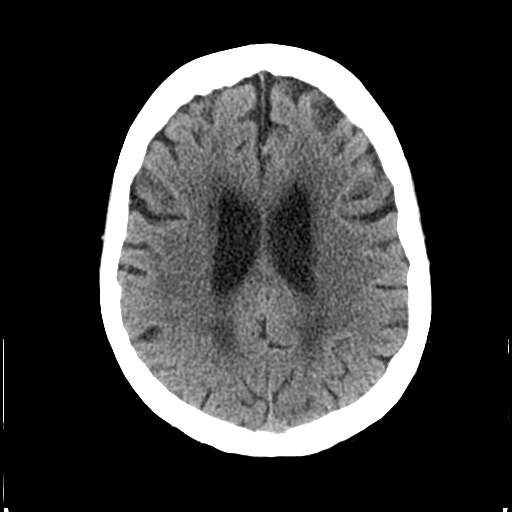
[im 22/31  brain]
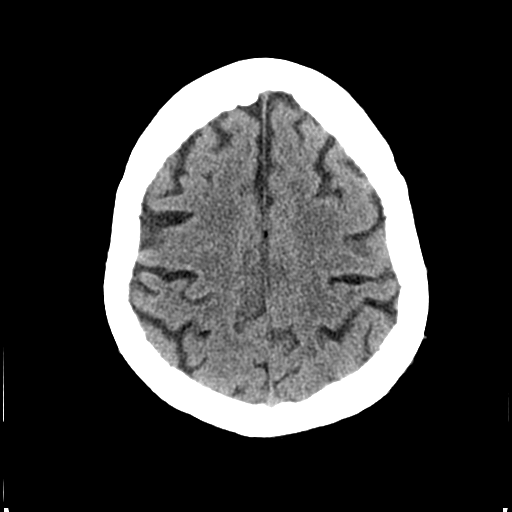
[im 25/31  brain]
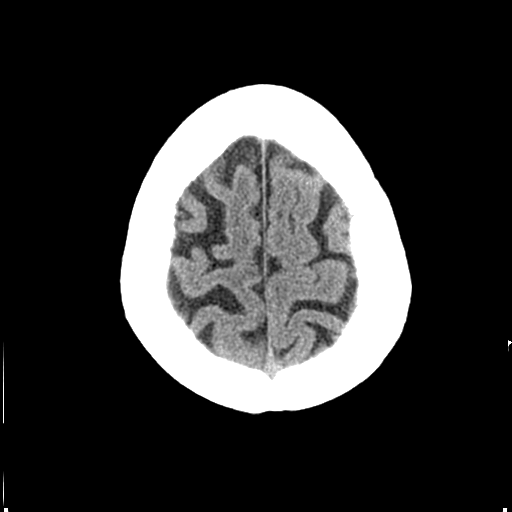
[im 28/31  brain]
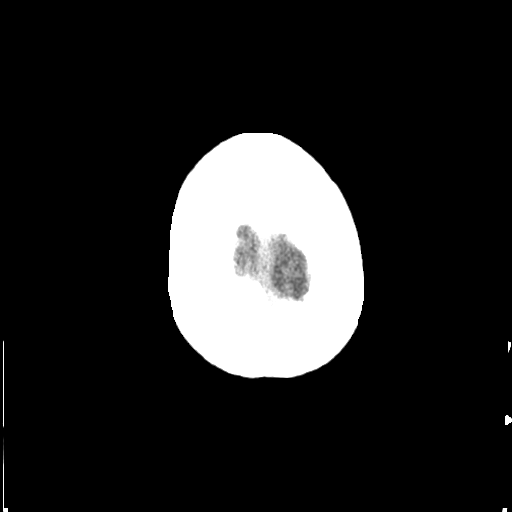
[im 28/31  bone]
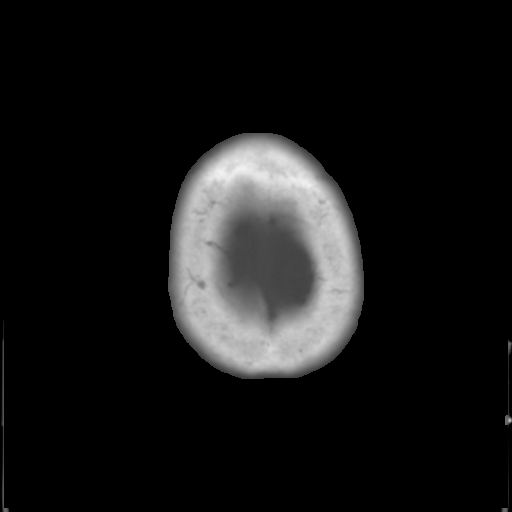

[Series 5: coronal · coronal · 0.30mm/px · 3 of 64 slices shown]
[im 22/64  brain]
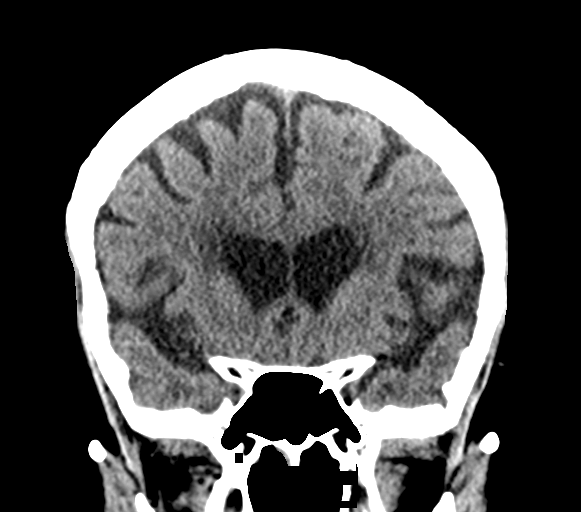
[im 29/64  brain]
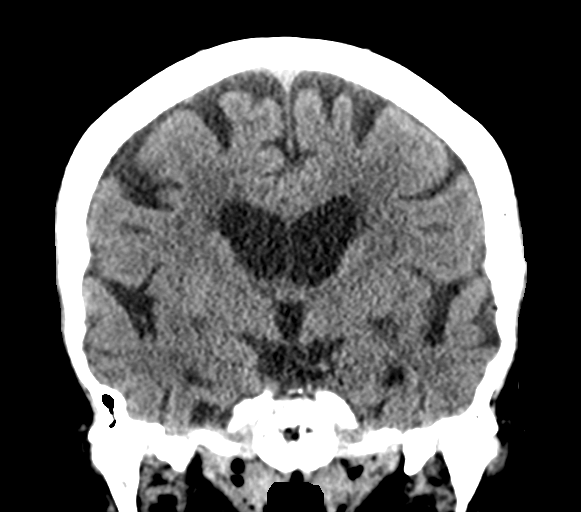
[im 36/64  brain]
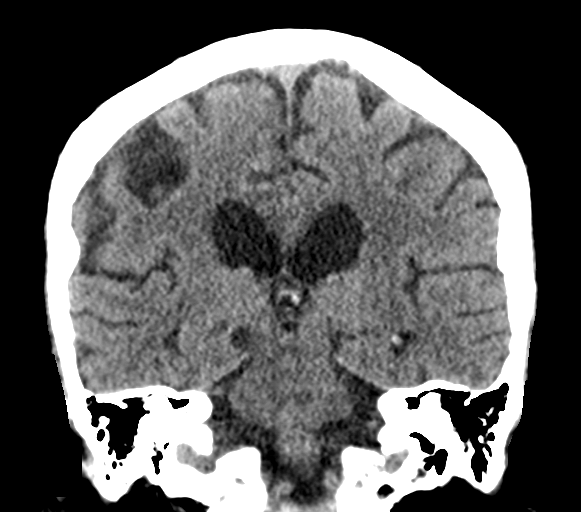

[Series 6: sagittal · sagittal · 0.32mm/px · 3 of 50 slices shown]
[im 17/50  brain]
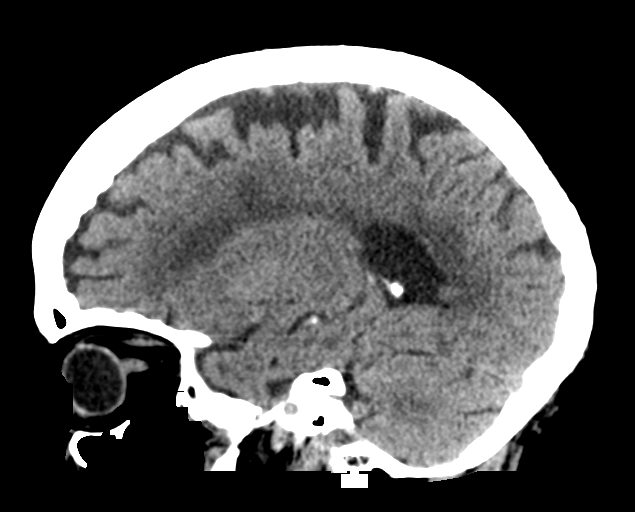
[im 25/50  brain]
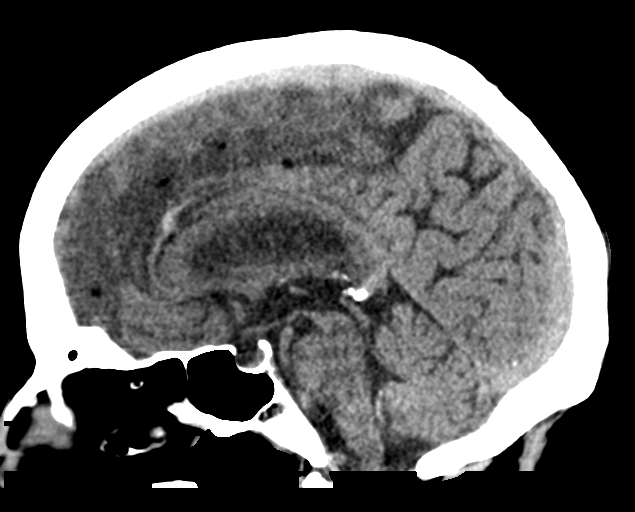
[im 33/50  brain]
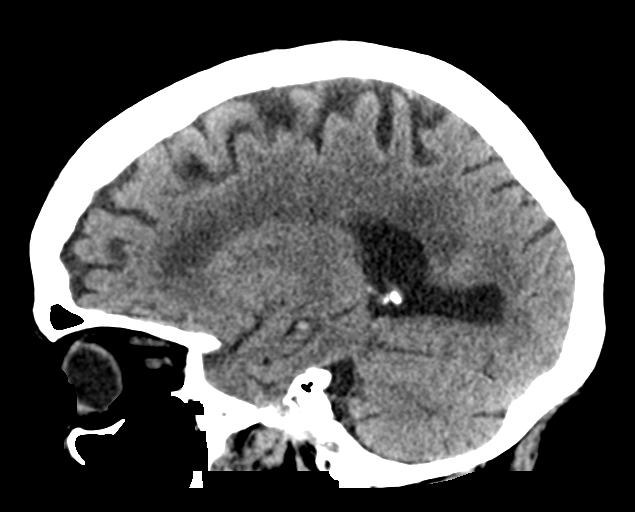

[15 of 47 positions shown; findings below may reference images not displayed]

FINDINGS: Brain: Mild age related cortical and deep atrophy. Moderate changes
of small vessel disease of the white matter diffusely. No mass
lesion. No midline shift. No acute hemorrhage or hematoma. No
extra-axial fluid collections. No evidence of acute infarction. No
interval change since the prior MRI.

Vascular: Moderate bilateral carotid siphon and bilateral vertebral
artery atherosclerosis.

Skull: No skull fracture or other focal osseous abnormality
involving the skull.

Sinuses/Orbits: Visualized paranasal sinuses, bilateral mastoid air
cells and bilateral middle ear cavities well-aerated. Visualized
orbits and globes are normal.

Other: None.
IMPRESSION: 1. No acute intracranial abnormality.
2. Stable mild age related cortical and deep atrophy. Stable
moderate chronic microvascular ischemic changes of the white matter.

## 2019-08-30 IMAGING — CT CT CHEST W/O CM
2 of 4 series · 15 of 36 positions shown, 18 images · non-contrast
Comparison: Chest and left rib radiographs dated 08/17/2017.

CLINICAL DATA: Left rib pain after falling out of bed and hitting
the side of the table.

EXAM:
CT CHEST WITHOUT CONTRAST
TECHNIQUE: Multidetector CT imaging of the chest was performed following the
standard protocol without IV contrast.

[Series 2: chest w/o st · axial · non-contrast · 0.77mm/px · z∈[-298,-54]mm · 12 of 146 slices shown, 15 images]
[im 12/146  mediastinal]
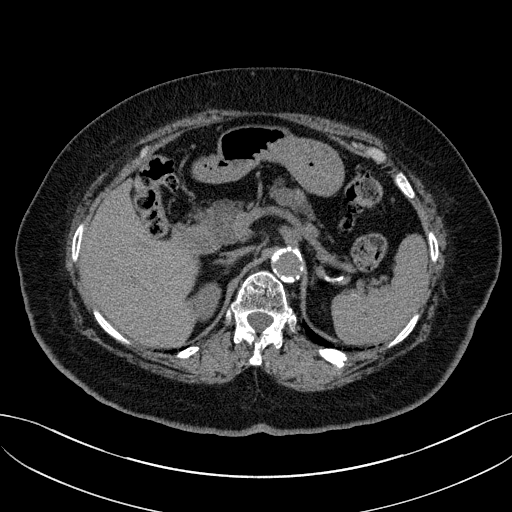
[im 12/146  lung]
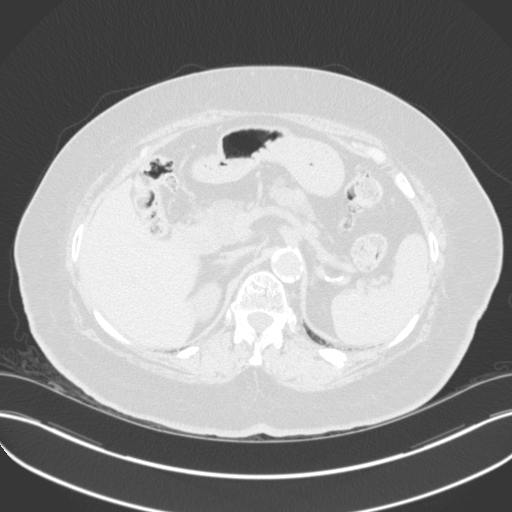
[im 23/146  lung]
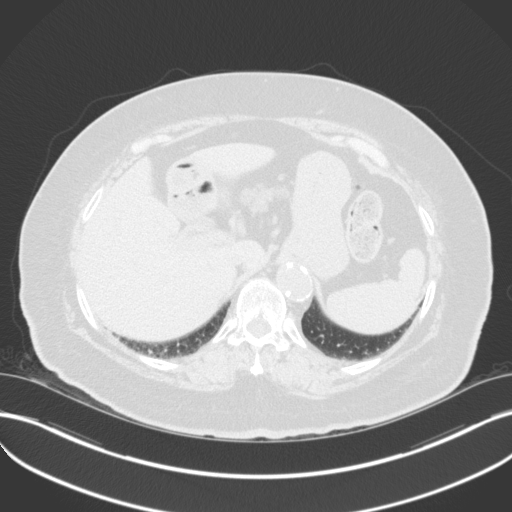
[im 34/146  lung]
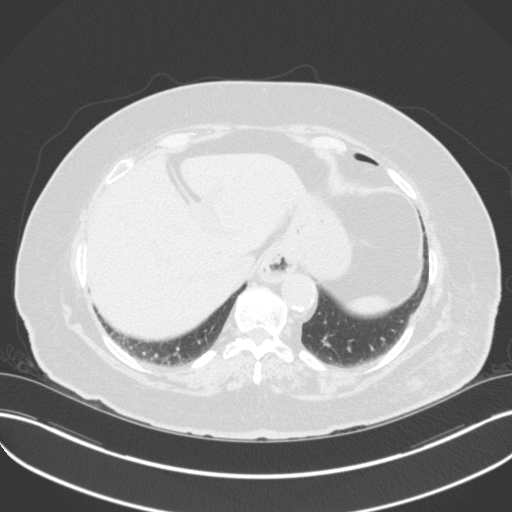
[im 45/146  lung]
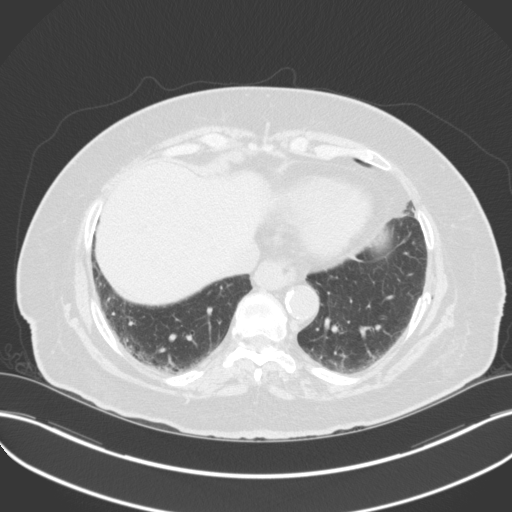
[im 56/146  mediastinal]
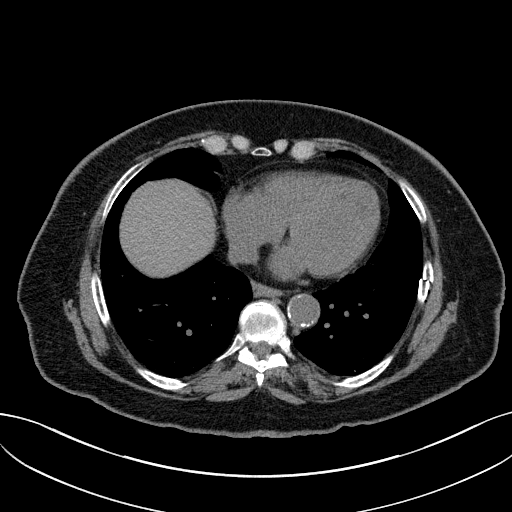
[im 56/146  lung]
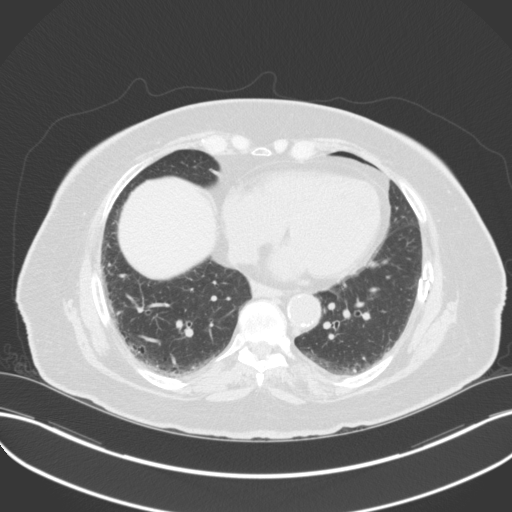
[im 67/146  lung]
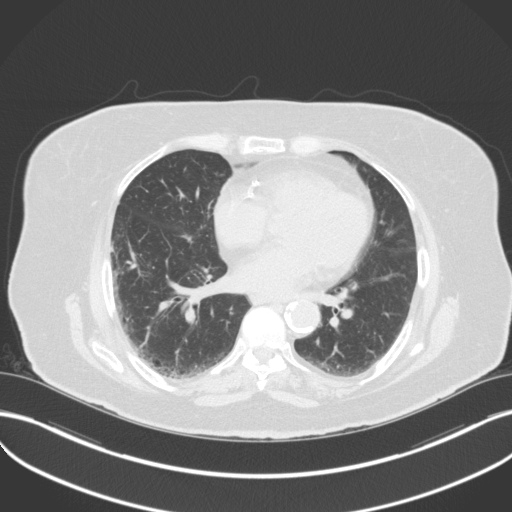
[im 79/146  lung]
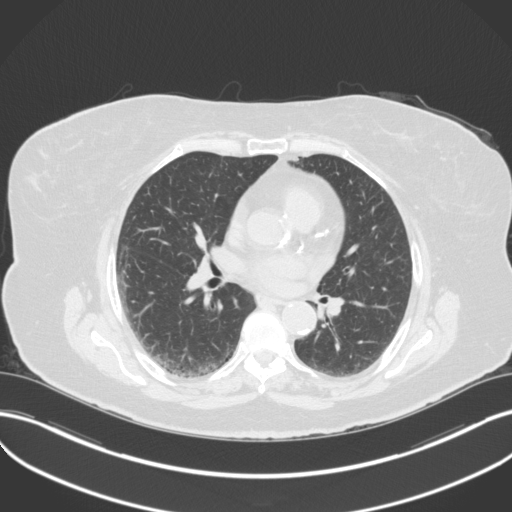
[im 90/146  lung]
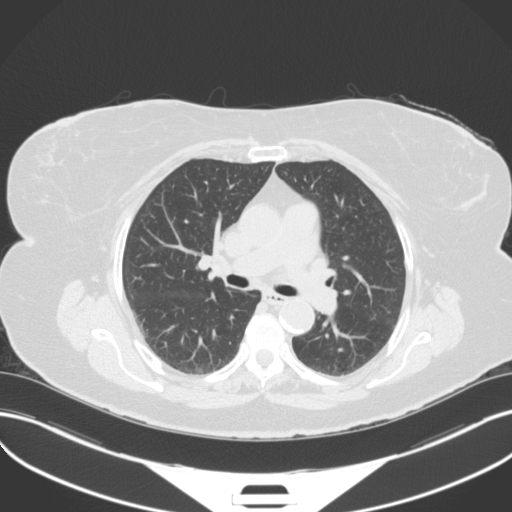
[im 101/146  mediastinal]
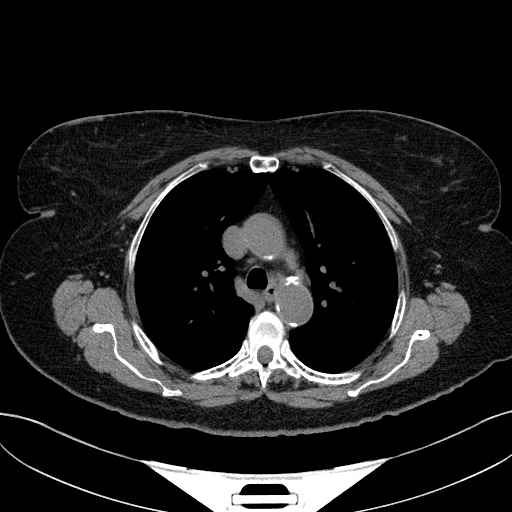
[im 101/146  lung]
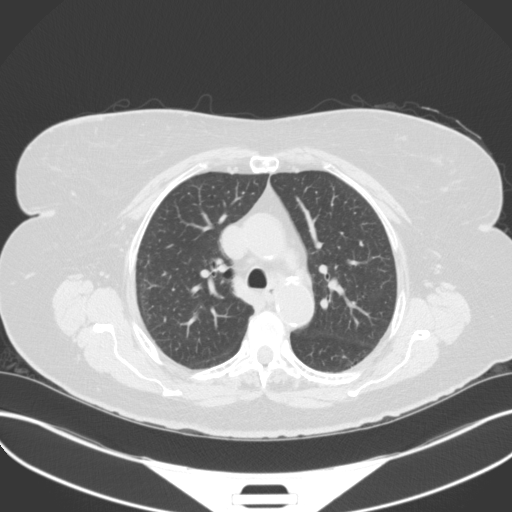
[im 112/146  lung]
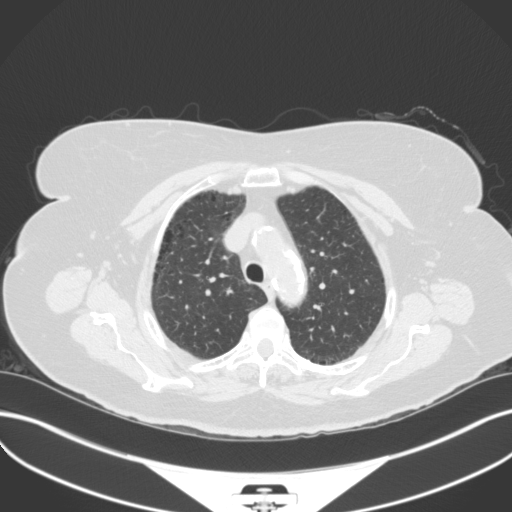
[im 123/146  lung]
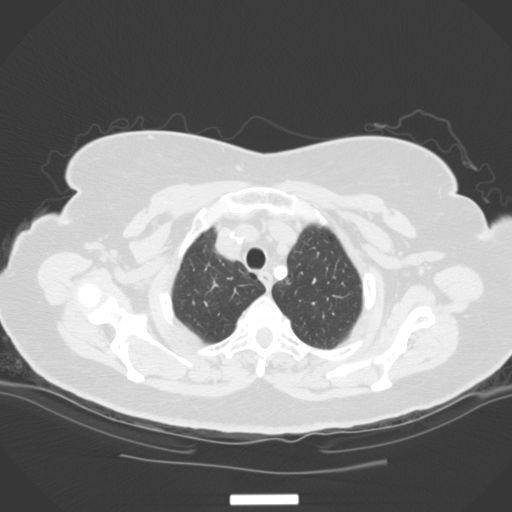
[im 134/146  lung]
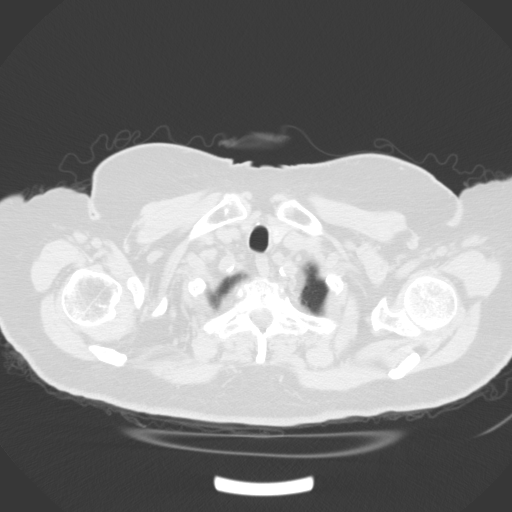

[Series 6: coronals · coronal · 0.58mm/px · 3 of 129 slices shown]
[im 26/129  lung]
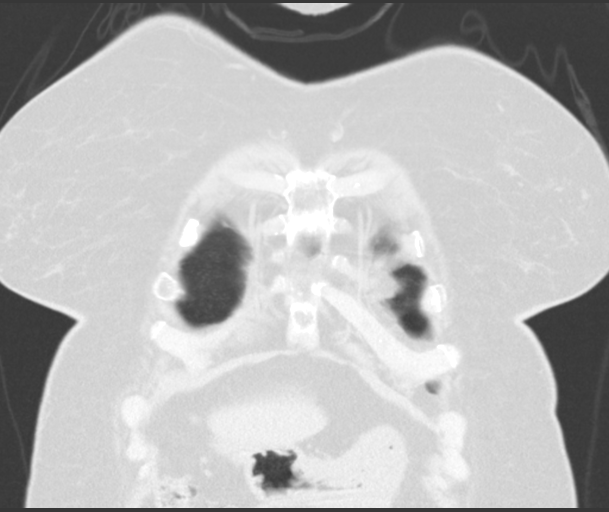
[im 52/129  lung]
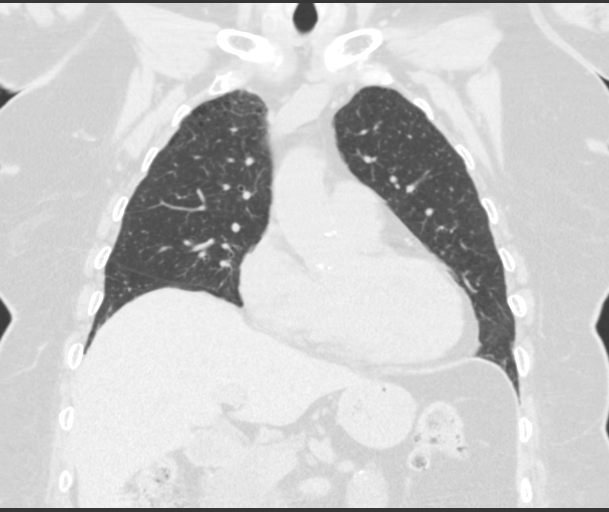
[im 77/129  lung]
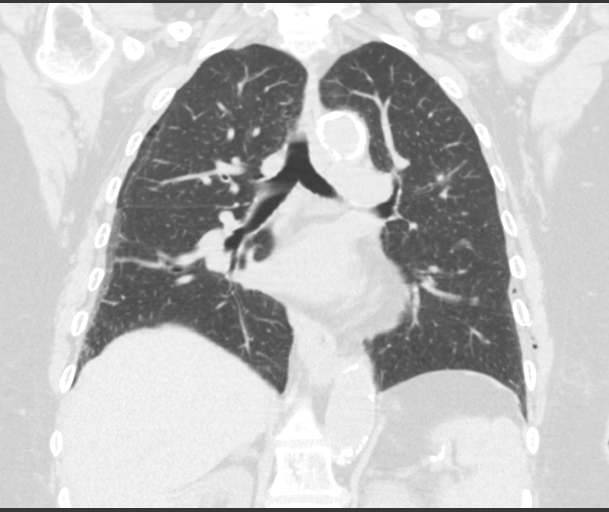

[15 of 36 positions shown; findings below may reference images not displayed]

FINDINGS: Cardiovascular: Dense atheromatous arterial calcifications,
including the thoracic aorta and coronary arteries. Normal sized
heart.

Mediastinum/Nodes: No enlarged lymph nodes. Multiple bilateral
thyroid nodules. The largest is on the left, measuring 1.6 cm in
maximum diameter. Small to moderate-sized hiatal hernia.

Lungs/Pleura: 1.6 x 1.3 x 1.2 cm ground-glass nodule in the left
lower lobe measured on axial image number 50 of series 5 and coronal
image number 80. This is oval in shape with irregular margins. No
other nodules are seen. There is a small area of focal atelectasis
or scarring in the superior aspect of the right lower lobe,
posteriorly, adjacent to the major fissure. Mild bilateral bullous
changes are noted. No pleural fluid or pneumothorax.

Upper Abdomen: Atheromatous arterial calcifications.

Musculoskeletal: Displaced left seventh and eighth lateral rib
fractures. Old, healed left tenth and eleventh rib fractures.
Minimal thoracic spine degenerative changes.
IMPRESSION: 1. **An incidental finding of potential clinical significance has
been found. There is a 1.6 x 1.3 x 1.2 cm ground-glass nodule in the
left lower lobe. This could be infectious or neoplastic. Initial
follow-up with CT at 6-12 months is recommended to confirm
persistence. If persistent, repeat CT is recommended every 2 years
until 5 years of stability has been established. This recommendation
follows the consensus statement: Guidelines for Management of
Incidental Pulmonary Nodules Detected on CT Images: From the
2. Displaced left seventh and eighth lateral rib fractures without
pneumothorax.
3. Mild changes of COPD with predominantly paraseptal components and
some centrilobular components.
4. Densely calcified coronary artery and aortic atherosclerosis.
5. Multiple bilateral thyroid nodules, the largest measuring 1.6 cm.
Consider further evaluation with thyroid ultrasound. If patient is
clinically hyperthyroid, consider nuclear medicine thyroid uptake
and scan.

Aortic Atherosclerosis (4Z5KC-KRB.B) and Emphysema (4Z5KC-81L.4).

## 2019-09-28 NOTE — Progress Notes (Deleted)
Angela Craig was seen today in the movement disorders clinic for neurologic consultation at the request of Dr. Ellan Lambertnasanya. 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  I have spoken to her referring physician on the phone about the patient and have reviewed medical records made available to me.  This patient is accompanied in the office by her daughter who supplements the history.Pt has also [facility] for these symptoms, but no diagnosis was given except mild neuropathy. The patient's first symptom was falls.  Falls started about 5 years ago.  However, they have increased recently.  She falls very frequently, about 3-4 times per week.  She was in the emergency room in September, October and March, all related to falls.  She always falls backward.  She last fell this morning.  She was in the kitchen and she tried to turn and then she was "on the floor."   Specific Symptoms:  Tremor: No. Family hx of similar:  No. Voice: weaker Sleep: sleeping well  Vivid Dreams:  No.  Acting out dreams:  No. Wet Pillows: No. Postural symptoms:  Yes.    Falls?  Yes.   Bradykinesia symptoms: difficulty getting out of a chair, difficulty regaining balance and drags the L leg and shortened stride length Loss of smell:  No. Loss of taste:  Yes.   Urinary Incontinence:  Yes.  , wears pad but doesn't always need it.  Pad is damp in the morning Difficulty Swallowing:  Yes.  , some trouble with large pills; some trouble dry foods Handwriting, micrographia: Yes.   Trouble with ADL's:  No.  Trouble buttoning clothing: No. Depression:  Occasionally from being in the house so much Memory changes:  Yes.  , lives with daughter, son, grandson.   Hallucinations:  No.  visual distortions: No. N/V:  No. Lightheaded:  Yes.    Syncope: No. Diplopia:  Yes.  , started about 6 months ago and had prism put in the lens and it really helped ([facility]).  [facility] for diplopia and those notes are reviewed.  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  Neuroimaging of the brain has previously been performed.  It is not available for my review today.  [facility].  I do not have the films.  It was reported to show atrophy and small vessel disease.  MRI of the cervical spine was performed on Apr 29, 2018.  Again, no films are made available to me.  There is no spinal cord abnormality.  There is some mild degenerative changes.  I did have the opportunity to review her head CT from March, 2019 and that was unremarkable.  MRI brain from 2016 was reviewed and demonstrated mod small vessel disease.  11/03/18 update: Patient is seen today in follow-up for PSP.  She is accompanied by her daughter who supplements the history.  Records have been reviewed since our last visit.  I started her on levodopa last visit.  She has tolerated the medication well.    She has not had hallucinations.  She has had continued falls.  She was admitted to the hospital in July, after I last saw her, after a fall.  She had not yet started the levodopa at the time of admission.  CT  of the brain showed small, bilateral subdural hematomas.  I personally reviewed those images.  Neurosurgery was consulted, but felt that no intervention was necessary.  I recommended an MBE but had thought that she was going to return to her primary neurologist, Dr. Cherylann Banas, so I didn't order that. They did apparently return to him but he thought that it best to resume care here. The MBE has not been completed, therefore.  Having some trouble with dry foods.  Daughter states that she had a cold before almost 6 weeks and the patient borrowed a compression vest t from another atypical patient (who has [place]) and that seemed to help.  Patient is no longer walking  alone.  Daughter does state that she is much more stable when transferring or walking with assist (no longer alone) and attributes that to the levodopa.  She is having diplopia.  Prisms have helped some.  Patient is attending the atypical support group.  She has done the most form.  She states that she is DNR, but her daughter states that she would want CPR.  Patient's daughter states that they need to update her living well.  09/30/19 update: Patient is seen today in follow-up for PSP.  She is accompanied by her daughter who supplements history.  Patient is on carbidopa/levodopa 25/100, 1 tablet 3 times per day.  Over the last several visits, we have talked to the patient about not walking at all because of falls and she states that ***.  PREVIOUS MEDICATIONS: none to date  ALLERGIES:   Allergies  Allergen Reactions   Iodine Other (See Comments)    Unknown    Novocain [Procaine] Other (See Comments)    Convulsions    Neosporin [Neomycin-Bacitracin Zn-Polymyx] Rash    CURRENT MEDICATIONS:  Outpatient Encounter Medications as of 09/30/2019  Medication Sig   carbidopa-levodopa (SINEMET IR) 25-100 MG tablet TAKE 1 TABLET BY MOUTH 3  TIMES DAILY   chlorthalidone (HYGROTON) 25 MG tablet Take 25 mg by mouth daily.   Cholecalciferol (VITAMIN D3) 2000 UNITS TABS Take 2,000 Units by mouth daily.    clobetasol cream (TEMOVATE) 1.61 % Apply 1 application topically daily as needed (eczema).    diclofenac sodium (VOLTAREN) 1 % GEL Apply 2 g topically daily as needed (pain).   esomeprazole (NEXIUM) 40 MG capsule Take 40 mg by mouth daily.    fluticasone (FLONASE) 50 MCG/ACT nasal spray Place 1 spray into both nostrils daily as needed for allergies.    furosemide (LASIX) 20 MG tablet Take 20 mg by mouth daily.   Liraglutide (VICTOZA) 18 MG/3ML SOPN Inject 1.2 mg into the skin every morning.    losartan (COZAAR) 100 MG tablet Take 100 mg by mouth daily.   metFORMIN (GLUCOPHAGE) 500 MG  tablet Take 500 mg by mouth daily with breakfast.   metoprolol (LOPRESSOR) 50 MG tablet Take 50 mg by mouth 2 (two) times daily.    Multiple Vitamin (MULTIVITAMIN) tablet Take 1 tablet by mouth daily.   oxybutynin (DITROPAN-XL) 5 MG 24 hr tablet Take 5 mg by mouth daily.   simvastatin (ZOCOR) 40 MG tablet Take 40 mg by mouth at bedtime.    No facility-administered encounter medications on file as of 09/30/2019.     PAST MEDICAL HISTORY:   Past Medical History:  Diagnosis Date   CAD (coronary artery disease)    Non-critical by catheterization on April 27,2006 with normal LV function--last echocardiogram in 2003 which showed normal LVEF--last nuclear stress in August 2010  showed no significant ischemia with an EF of 70%   Diabetes mellitus (HCC)    Dizziness    Hyperlipidemia    Hypertension    Obstructive sleep apnea    followed by Dr. Tresa Endo    PAST SURGICAL HISTORY:   Past Surgical History:  Procedure Laterality Date   APPENDECTOMY     breast tumor     benign   CARDIAC CATHETERIZATION     CATARACT EXTRACTION, BILATERAL     CHOLECYSTECTOMY     FOOT SURGERY     TUBAL LIGATION      SOCIAL HISTORY:   Social History   Socioeconomic History   Marital status: Widowed    Spouse name: Not on file   Number of children: 5   Years of education: 56    Highest education level: Not on file  Occupational History   Occupation: Retired  Ecologist strain: Not on file   Food insecurity    Worry: Not on file    Inability: Not on Occupational hygienist needs    Medical: Not on file    Non-medical: Not on file  Tobacco Use   Smoking status: Current Every Day Smoker    Types: E-cigarettes   Smokeless tobacco: Current User   Tobacco comment: quit cigarettes in 2014; now vape  Substance and Sexual Activity   Alcohol use: No    Alcohol/week: 0.0 standard drinks   Drug use: No   Sexual activity: Not on file  Lifestyle    Physical activity    Days per week: Not on file    Minutes per session: Not on file   Stress: Not on file  Relationships   Social connections    Talks on phone: Not on file    Gets together: Not on file    Attends religious service: Not on file    Active member of club or organization: Not on file    Attends meetings of clubs or organizations: Not on file    Relationship status: Not on file   Intimate partner violence    Fear of current or ex partner: Not on file    Emotionally abused: Not on file    Physically abused: Not on file    Forced sexual activity: Not on file  Other Topics Concern   Not on file  Social History Narrative   Drinks about a pot of coffee a day, no tea or soda    FAMILY HISTORY:   Family Status  Relation Name Status   Mother  Deceased at age 27   Father  Deceased at age 64   Sister  Deceased   Brother  Deceased   Brother  Deceased   Sister  Deceased   Child  Deceased   Child 4 Alive    ROS:  ROS  PHYSICAL EXAMINATION:    VITALS:   There were no vitals filed for this visit.  GEN:  The patient appears stated age and is in NAD. HEENT:  Normocephalic, atraumatic.  The mucous membranes are moist. The superficial temporal arteries are without ropiness or tenderness. CV:  RRR Lungs:  CTAB Neck/HEME:  There are no carotid bruits bilaterally.  Neurological examination:  Orientation: The patient is alert and oriented x3. Cranial nerves: There is good facial symmetry.  There is facial hypomimia.  The speech is fluent and clear.  She has trouble with the guttural sounds.  Horizontal gaze is intact.  She is still able  to look up and down, but this is somewhat restricted.  There are a few square wave jerks.  Soft palate rises symmetrically and there is no tongue deviation. Hearing is intact to conversational tone. Sensation: Sensation is intact to light touch throughout Motor: Strength is 5/5 in the bilateral upper and lower extremities.    Shoulder shrug is equal and symmetric.  There is no pronator drift.   Movement examination: Tone: There is mild increased tone in the LUE.   Abnormal movements: none Coordination:  There is significant decremation with RAM's, with any form of RAMS, including alternating supination and pronation of the forearm, hand opening and closing, finger taps, heel taps and toe taps, L much more than right Gait and Station: Did not ambulate the patient today as I really do not want her walking.  She is in a transport chair  Labs: Patient had lab work completed.  Lab work was dated June 04, 2018.  Total cholesterol was 149.  Triglycerides were elevated at 190.  HDL was 45.  White blood cells were 7.8, hemoglobin 11.4, hematocrit 35.5.  Sodium was 139, potassium 4.4, chloride 103, CO2 26, BUN 25, creatinine 1.1, glucose 189, AST 19, ALT 20, alkaline phosphatase 131.  Hemoglobin A1c 7.5.  TSH was 3.06.  Acetylcholine receptor antibody 0.0.  SPEP was negative.  ASSESSMENT/PLAN:  1.  PSP  -Discussed again the importance of staying seated at all times.  Patient has had very serious consequences of falls, including bilateral subdural hematomas.  -Continue carbidopa/levodopa 25/100, 1 tablet 3 times per day.  -Last modified barium swallow on December 23, 2018.  This was unremarkable.  -Patient's advanced directives are up-to-date.  She is DNR.  [facility] MOST FORM has been completed.   Cc:  Dois Davenport, MD

## 2019-09-30 ENCOUNTER — Ambulatory Visit: Payer: Self-pay | Admitting: Neurology

## 2020-02-02 ENCOUNTER — Other Ambulatory Visit: Payer: Self-pay | Admitting: Neurology

## 2020-02-22 NOTE — Progress Notes (Signed)
Angela Craig was seen today in follow up for PSP.  My previous records were reviewed prior to todays visit as well as outside records available to me.  Discussed with patient last visit the importance of staying seated at all times. Daughter states that end of December she had a UTI and they had trouble treating it.  Then she had signficant BP reduction.  They stopped 5 meds.  Her bP has slowing been going back up.  She is now in hospice care.  She has spells where she has trouble speaking.  She has thickener in her water and coffee now.  She is having trouble letting go of things, so much so that she is calling the life alert because she doesn't let go of it.  Daughter states that she is having some spells where she is having trouble speaking.  She will stop and seemed to be less than responsive.  This seemed to occur all of a sudden.  Current prescribed movement disorder medications: Carbidopa/levodopa 25/100, 1 tablet 3 times per day   ALLERGIES:   Allergies  Allergen Reactions  . Iodine Other (See Comments)    Unknown   . Novocain [Procaine] Other (See Comments)    Convulsions   . Neosporin [Neomycin-Bacitracin Zn-Polymyx] Rash    CURRENT MEDICATIONS:  Outpatient Encounter Medications as of 02/24/2020  Medication Sig  . acetaminophen (TYLENOL) 325 MG tablet Take 650 mg by mouth 3 (three) times daily.  Marland Kitchen alendronate (FOSAMAX) 70 MG tablet Take 70 mg by mouth once a week. Take with a full glass of water on an empty stomach.  . carbidopa-levodopa (SINEMET IR) 25-100 MG tablet TAKE 1 TABLET BY MOUTH 3  TIMES DAILY  . Cholecalciferol (VITAMIN D3) 2000 UNITS TABS Take 2,000 Units by mouth daily.   . clobetasol cream (TEMOVATE) 0.05 % Apply 1 application topically daily as needed (eczema).   . diclofenac sodium (VOLTAREN) 1 % GEL Apply 2 g topically daily as needed (pain).  Marland Kitchen esomeprazole (NEXIUM) 40 MG capsule Take 40 mg by mouth daily.   . fluticasone (FLONASE) 50 MCG/ACT nasal  spray Place 1 spray into both nostrils daily as needed for allergies.   . Liraglutide (VICTOZA) 18 MG/3ML SOPN Inject 1.2 mg into the skin every morning.   . metFORMIN (GLUCOPHAGE) 500 MG tablet Take 500 mg by mouth daily with breakfast.  . metoprolol (LOPRESSOR) 50 MG tablet Take 50 mg by mouth 2 (two) times daily.   . Multiple Vitamin (MULTIVITAMIN) tablet Take 1 tablet by mouth daily.  . simvastatin (ZOCOR) 40 MG tablet Take 40 mg by mouth at bedtime.   . [DISCONTINUED] chlorthalidone (HYGROTON) 25 MG tablet Take 25 mg by mouth daily.  . [DISCONTINUED] furosemide (LASIX) 20 MG tablet Take 20 mg by mouth daily.  . [DISCONTINUED] losartan (COZAAR) 100 MG tablet Take 100 mg by mouth daily.  . [DISCONTINUED] oxybutynin (DITROPAN-XL) 5 MG 24 hr tablet Take 5 mg by mouth daily.   No facility-administered encounter medications on file as of 02/24/2020.    PHYSICAL EXAMINATION:    VITALS:   Vitals:   02/24/20 1030  BP: (!) 153/71  Pulse: 61  SpO2: 98%  Height: 5' 3.5" (1.613 m)    GEN:  The patient appears stated age and is in NAD. HEENT:  Normocephalic, atraumatic.  The mucous membranes are moist. The superficial temporal arteries are without ropiness or tenderness. CV:  RRR Lungs:  CTAB Neck/HEME:  There are no carotid bruits bilaterally.  Neurological examination:  Orientation: The patient is alert and oriented x2 Cranial nerves: There is good facial symmetry with significant facial hypomimia. The speech is dysphasic. Soft palate rises symmetrically and there is no tongue deviation. Hearing is intact to conversational tone. Sensation: Sensation is intact to light touch throughout Motor: Strength is at least antigravity x4.  Movement examination: Tone: There is significant increased tone, left more than right.  Some of this is paratonia Abnormal movements: None seen Coordination:  There is slowness with all rapid alternating movements Gait and Station: The patient is no longer  able to ambulate.   ASSESSMENT/PLAN:  1.  PSP  -Continue carbidopa/levodopa 25/100, 1 tablet 3 times per day  -Patient should be seated at all times.  Has already had bilateral subdural hematomas as well as a mental status change with a fall previously.  She no longer ambulates.  Daughter does state that she occasionally has left at home for a few hours, but otherwise has full-time care.  Discussed that really she should have 24 hour/day care.  She should not be left alone.  -Last modified barium swallow was in January, 2020.  This was unremarkable.  She has gotten markedly worse.  They are thickening her liquids, even the water.  -Patient has updated advanced directives.  She has updated DNR.  She is now in hospice.  -She is having some spells where she is not talking as well and she seems to be just staring.  I suspect that this is just advancement of the disease and unlikely a form of apraxia, which they are seeing another forms as well.  I saw her in the room today, and it did not appear to be anything otherwise.  However, we will do an EEG just to make sure she is not having seizure.  Total time spent on today's visit was 30 minutes, including both face-to-face time and nonface-to-face time.  Time included that spent on review of records (prior notes available to me/labs/imaging if pertinent), discussing treatment and goals, answering patient's questions and coordinating care (had to call the EEG tech in from home).  Cc:  Hayden Rasmussen, MD

## 2020-02-24 ENCOUNTER — Encounter: Payer: Self-pay | Admitting: Neurology

## 2020-02-24 ENCOUNTER — Other Ambulatory Visit: Payer: Self-pay

## 2020-02-24 ENCOUNTER — Ambulatory Visit (INDEPENDENT_AMBULATORY_CARE_PROVIDER_SITE_OTHER): Admitting: Neurology

## 2020-02-24 ENCOUNTER — Ambulatory Visit (INDEPENDENT_AMBULATORY_CARE_PROVIDER_SITE_OTHER): Payer: Medicare Other | Admitting: Neurology

## 2020-02-24 VITALS — BP 153/71 | HR 61 | Ht 63.5 in

## 2020-02-24 DIAGNOSIS — R1319 Other dysphagia: Secondary | ICD-10-CM | POA: Diagnosis not present

## 2020-02-24 DIAGNOSIS — R404 Transient alteration of awareness: Secondary | ICD-10-CM

## 2020-02-24 DIAGNOSIS — R9401 Abnormal electroencephalogram [EEG]: Secondary | ICD-10-CM | POA: Diagnosis not present

## 2020-02-24 DIAGNOSIS — G231 Progressive supranuclear ophthalmoplegia [Steele-Richardson-Olszewski]: Secondary | ICD-10-CM | POA: Diagnosis not present

## 2020-02-25 NOTE — Procedures (Signed)
TECHNICAL SUMMARY:  A multichannel referential and bipolar montage EEG using the standard international 10-20 system was performed on the patient described as awake.  The dominant background activity consists of 6 hertz activity seen most prominantly over the posterior head region.  Much slower 2 to 3 Hz activity is seen in the frontal head regions.    Low voltage fast (beta) activity is distributed symmetrically and maximally over the anterior head regions.  ACTIVATION:  Stepwise photic stimulation at 4-20 flashes per second was performed and did not elicit any abnormal waveforms.  Hyperventilation was not performed.  EPILEPTIFORM ACTIVITY:  There were no spikes, sharp waves or paroxysmal activity.  SLEEP: Physiologic drowsiness is noted.   IMPRESSION:  This is an abnormal EEG demonstrating a moderate diffuse slowing of electrocerebral activity.  This can be seen in a wide variety of encephalopathic state including those of a toxic, metabolic, or degenerative nature.  There were no focal, hemispheric, or lateralizing features.  No epileptiform activity was recorded.

## 2020-03-01 ENCOUNTER — Telehealth: Payer: Self-pay | Admitting: Neurology

## 2020-03-01 NOTE — Telephone Encounter (Signed)
Not sure that we can do that without a release.  I didn't send her to hospice.

## 2020-03-01 NOTE — Telephone Encounter (Signed)
Lillia Abed from [facility] called and requested this patient's most recent visit notes from 02/24/20 to be faxed to: [phone], Attn: Lillia Abed.

## 2020-03-01 NOTE — Telephone Encounter (Signed)
Spoke with Angela Craig and she stated the patient signed a release upon entry to Hospice and she will fax over a copy. Fax number given.

## 2020-03-02 ENCOUNTER — Telehealth: Payer: Self-pay | Admitting: Neurology

## 2020-03-02 NOTE — Telephone Encounter (Signed)
See yesterdays phone call.  Will need a release.

## 2020-03-02 NOTE — Telephone Encounter (Signed)
Shavonne from Hospice called regarding this patient and them needing Dr. Don Perking most recent office visit notes faxed over to [phone].  Thank you

## 2020-03-03 NOTE — Telephone Encounter (Signed)
No number life for call back. Fax cover sheet faxed requesting release.   Will wait for release before sending office notes.-Per Dr Arbutus Leas

## 2020-03-03 NOTE — Telephone Encounter (Signed)
Hospice called back. Have not received the patients office notes. Was given medical records number in order to check the status.

## 2020-03-03 NOTE — Telephone Encounter (Signed)
Mychart message sent to patient's daughter.

## 2020-04-15 DEATH — deceased

## 2020-11-07 NOTE — Progress Notes (Signed)
Pt with appt for VV today.  Called many times.  Link sent.  No response from patient or daughter.

## 2020-11-15 ENCOUNTER — Other Ambulatory Visit: Payer: Self-pay

## 2020-11-15 ENCOUNTER — Encounter (INDEPENDENT_AMBULATORY_CARE_PROVIDER_SITE_OTHER): Payer: Medicare Other | Admitting: Neurology

## 2020-11-15 DIAGNOSIS — Z029 Encounter for administrative examinations, unspecified: Secondary | ICD-10-CM

## 2020-11-17 ENCOUNTER — Encounter: Payer: Self-pay | Admitting: Neurology
